# Patient Record
Sex: Male | Born: 1995 | Race: White | Hispanic: No | Marital: Married | Smoking: Never smoker
Health system: Southern US, Community
[De-identification: ages and names within clinical notes are randomized; demographics above are authoritative.]

## PROBLEM LIST (undated history)

## (undated) DIAGNOSIS — K219 Gastro-esophageal reflux disease without esophagitis: Secondary | ICD-10-CM

## (undated) DIAGNOSIS — F909 Attention-deficit hyperactivity disorder, unspecified type: Secondary | ICD-10-CM

## (undated) DIAGNOSIS — D690 Allergic purpura: Secondary | ICD-10-CM

## (undated) DIAGNOSIS — L709 Acne, unspecified: Secondary | ICD-10-CM

## (undated) HISTORY — DX: Acne, unspecified: L70.9

## (undated) HISTORY — DX: Gastro-esophageal reflux disease without esophagitis: K21.9

## (undated) HISTORY — PX: KNEE SURGERY: SHX244

---
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
## 2014-11-29 ENCOUNTER — Encounter (HOSPITAL_COMMUNITY): Payer: Self-pay

## 2014-11-29 ENCOUNTER — Emergency Department (HOSPITAL_COMMUNITY): Payer: BLUE CROSS/BLUE SHIELD

## 2014-11-29 ENCOUNTER — Emergency Department (HOSPITAL_COMMUNITY)
Admission: EM | Admit: 2014-11-29 | Discharge: 2014-11-29 | Disposition: A | Discharge status: Home / Self Care | Payer: BLUE CROSS/BLUE SHIELD | Attending: Emergency Medicine | Admitting: Emergency Medicine

## 2014-11-29 DIAGNOSIS — R110 Nausea: Secondary | ICD-10-CM | POA: Diagnosis not present

## 2014-11-29 DIAGNOSIS — R1030 Lower abdominal pain, unspecified: Secondary | ICD-10-CM | POA: Diagnosis present

## 2014-11-29 DIAGNOSIS — R1084 Generalized abdominal pain: Secondary | ICD-10-CM

## 2014-11-29 DIAGNOSIS — Z79899 Other long term (current) drug therapy: Secondary | ICD-10-CM | POA: Insufficient documentation

## 2014-11-29 DIAGNOSIS — Z872 Personal history of diseases of the skin and subcutaneous tissue: Secondary | ICD-10-CM | POA: Diagnosis not present

## 2014-11-29 DIAGNOSIS — M545 Low back pain: Secondary | ICD-10-CM | POA: Diagnosis not present

## 2014-11-29 DIAGNOSIS — Z8719 Personal history of other diseases of the digestive system: Secondary | ICD-10-CM | POA: Insufficient documentation

## 2014-11-29 DIAGNOSIS — D72829 Elevated white blood cell count, unspecified: Secondary | ICD-10-CM

## 2014-11-29 HISTORY — DX: Allergic purpura: D69.0

## 2014-11-29 LAB — COMPREHENSIVE METABOLIC PANEL
ALBUMIN: 4.8 g/dL (ref 3.5–5.0)
ALT: 16 U/L — AB (ref 17–63)
AST: 18 U/L (ref 15–41)
Alkaline Phosphatase: 100 U/L (ref 38–126)
Anion gap: 8 (ref 5–15)
BILIRUBIN TOTAL: 1.3 mg/dL — AB (ref 0.3–1.2)
BUN: 10 mg/dL (ref 6–20)
CHLORIDE: 98 mmol/L — AB (ref 101–111)
CO2: 30 mmol/L (ref 22–32)
Calcium: 9.4 mg/dL (ref 8.9–10.3)
Creatinine, Ser: 0.98 mg/dL (ref 0.61–1.24)
GFR calc Af Amer: >60 mL/min (ref >60)
GFR calc non Af Amer: >60 mL/min (ref >60)
GLUCOSE: 102 mg/dL — AB (ref 65–99)
POTASSIUM: 4 mmol/L (ref 3.5–5.1)
Sodium: 136 mmol/L (ref 135–145)
Total Protein: 8.5 g/dL — ABNORMAL HIGH (ref 6.5–8.1)

## 2014-11-29 LAB — CBC WITH DIFFERENTIAL/PLATELET
BASOS ABS: 0 10*3/uL (ref 0.0–0.1)
BASOS PCT: 0 %
Eosinophils Absolute: 0.1 10*3/uL (ref 0.0–0.7)
Eosinophils Relative: 0 %
HEMATOCRIT: 50.6 % (ref 39.0–52.0)
Hemoglobin: 18.1 g/dL — ABNORMAL HIGH (ref 13.0–17.0)
Lymphocytes Relative: 6 %
Lymphs Abs: 1.4 10*3/uL (ref 0.7–4.0)
MCH: 32.8 pg (ref 26.0–34.0)
MCHC: 35.8 g/dL (ref 30.0–36.0)
MCV: 91.7 fL (ref 78.0–100.0)
Monocytes Absolute: 1.8 10*3/uL — ABNORMAL HIGH (ref 0.1–1.0)
Monocytes Relative: 8 %
NEUTROS ABS: 19.9 10*3/uL — AB (ref 1.7–7.7)
Neutrophils Relative %: 86 %
Platelets: 216 10*3/uL (ref 150–400)
RBC: 5.52 MIL/uL (ref 4.22–5.81)
RDW: 12.2 % (ref 11.5–15.5)
WBC: 23.2 10*3/uL — AB (ref 4.0–10.5)

## 2014-11-29 LAB — URINALYSIS, ROUTINE W REFLEX MICROSCOPIC
Bilirubin Urine: NEGATIVE
Glucose, UA: NEGATIVE mg/dL
Hgb urine dipstick: NEGATIVE
Ketones, ur: 15 mg/dL — AB
Leukocytes, UA: NEGATIVE
NITRITE: NEGATIVE
Protein, ur: NEGATIVE mg/dL
SPECIFIC GRAVITY, URINE: 1.025 (ref 1.005–1.030)
Urobilinogen, UA: 1 mg/dL (ref 0.0–1.0)
pH: 7 (ref 5.0–8.0)

## 2014-11-29 LAB — LIPASE, BLOOD: Lipase: 28 U/L (ref 22–51)

## 2014-11-29 MED ORDER — SODIUM CHLORIDE 0.9 % IV BOLUS (SEPSIS)
1000.0000 mL | Freq: Once | INTRAVENOUS | Status: AC
  Administered 2014-11-29: 1000 mL via INTRAVENOUS

## 2014-11-29 MED ORDER — IOHEXOL 300 MG/ML  SOLN
100.0000 mL | Freq: Once | INTRAMUSCULAR | Status: AC | PRN
  Administered 2014-11-29: 100 mL via INTRAVENOUS

## 2014-11-29 MED ORDER — DICYCLOMINE HCL 20 MG PO TABS: 20.0000 mg | ORAL_TABLET | Freq: Two times a day (BID) | ORAL | Status: DC

## 2014-11-29 MED ORDER — DIPHENHYDRAMINE HCL 50 MG/ML IJ SOLN
25.0000 mg | Freq: Once | INTRAMUSCULAR | Status: AC
  Administered 2014-11-29: 25 mg via INTRAVENOUS
  Filled 2014-11-29: qty 1

## 2014-11-29 MED ORDER — HYDROCODONE-ACETAMINOPHEN 5-325 MG PO TABS: 1.0000 | ORAL_TABLET | Freq: Four times a day (QID) | ORAL | Status: DC | PRN

## 2014-11-29 MED ORDER — NAPROXEN 500 MG PO TABS: 500.0000 mg | ORAL_TABLET | Freq: Two times a day (BID) | ORAL | Status: DC

## 2014-11-29 MED ORDER — ONDANSETRON HCL 4 MG/2ML IJ SOLN
4.0000 mg | Freq: Once | INTRAMUSCULAR | Status: AC
  Administered 2014-11-29: 4 mg via INTRAVENOUS
  Filled 2014-11-29: qty 2

## 2014-11-29 MED ORDER — HYDROMORPHONE HCL 1 MG/ML IJ SOLN
0.5000 mg | Freq: Once | INTRAMUSCULAR | Status: AC
  Administered 2014-11-29: 0.5 mg via INTRAVENOUS
  Filled 2014-11-29: qty 1

## 2014-11-29 MED ORDER — ONDANSETRON HCL 4 MG PO TABS: 4.0000 mg | ORAL_TABLET | Freq: Three times a day (TID) | ORAL | Status: DC | PRN

## 2014-11-29 MED ORDER — DICYCLOMINE HCL 10 MG PO CAPS
10.0000 mg | ORAL_CAPSULE | Freq: Once | ORAL | Status: AC
  Administered 2014-11-29: 10 mg via ORAL
  Filled 2014-11-29 (×2): qty 1

## 2014-11-29 MED ORDER — PROCHLORPERAZINE EDISYLATE 5 MG/ML IJ SOLN
5.0000 mg | Freq: Once | INTRAMUSCULAR | Status: AC
  Administered 2014-11-29: 5 mg via INTRAVENOUS
  Filled 2014-11-29: qty 2

## 2014-11-29 MED ORDER — FENTANYL CITRATE (PF) 100 MCG/2ML IJ SOLN
50.0000 ug | Freq: Once | INTRAMUSCULAR | Status: AC
  Administered 2014-11-29: 50 ug via INTRAVENOUS
  Filled 2014-11-29: qty 2

## 2014-11-29 MED ORDER — IOHEXOL 300 MG/ML  SOLN
50.0000 mL | Freq: Once | INTRAMUSCULAR | Status: DC | PRN
  Administered 2014-11-29: 50 mL via ORAL
  Filled 2014-11-29: qty 50

## 2014-11-29 MED ORDER — KETOROLAC TROMETHAMINE 30 MG/ML IJ SOLN
30.0000 mg | Freq: Once | INTRAMUSCULAR | Status: AC
  Administered 2014-11-29: 30 mg via INTRAVENOUS
  Filled 2014-11-29: qty 1

## 2014-11-29 NOTE — ED Notes (Signed)
Pt c/o lower abdominal pain, low back pain, and nausea x 2 days.  Pain score 9/10.  Pt report taking ibuprofen last night.  Denies diarrhea and GU complaints.

## 2014-11-29 NOTE — ED Provider Notes (Addendum)
CSN: [id number]     Arrival date & time 11/29/14  0254 History   First MD Initiated Contact with Patient 11/29/14 [phone]     Chief Complaint  Patient presents with  . Abdominal Pain  . Back Pain  . Nausea     (Consider location/radiation/quality/duration/timing/severity/associated sxs/prior Treatment) The history is provided by the patient and a parent. No language interpreter was used.     Gregory Conway Is a 19 year old male with a past medical history of Henoch-Schnlein purpura presents emergency Department with a chief complaint of abdominal pain. Patient states it began 2 days ago. He describes aching pain in his suprapubic region. He has associated back pain, pain in the bilateral lower quadrants, which she feels is worse on the right. He has had very little appetite, severe nausea without vomiting or diarrhea. He denies fevers at home. He has associated myalgias.   Past Medical History  Diagnosis Date  . Acne   . GERD (gastroesophageal reflux disease)   . HSP (Henoch Schonlein purpura) (HCC)    History reviewed. No pertinent past surgical history. History reviewed. No pertinent family history. Social History  Substance Use Topics  . Smoking status: Never Smoker   . Smokeless tobacco: None  . Alcohol Use: Yes     Comment: rarely    Review of Systems  Ten systems reviewed and are negative for acute change, except as noted in the HPI.    Allergies  Review of patient's allergies indicates no known allergies.  Home Medications   Prior to Admission medications   Medication Sig Start Date End Date Taking? Authorizing Provider  glucosamine-chondroitin 500-400 MG tablet Take 1 tablet by mouth daily.   Yes Historical Provider, MD  ibuprofen (ADVIL,MOTRIN) 200 MG tablet Take 200 mg by mouth every 6 (six) hours as needed for fever, headache, mild pain, moderate pain or cramping.   Yes Historical Provider, MD   BP 141/78 mmHg  Pulse 66  Temp(Src) 98.1 F (36.7 C) (Oral)   Resp 16  SpO2 99% Physical Exam  Constitutional: He appears well-developed and well-nourished. No distress.  HENT:  Head: Normocephalic and atraumatic.  Eyes: Conjunctivae are normal. No scleral icterus.  Neck: Normal range of motion. Neck supple.  Cardiovascular: Normal rate, regular rhythm and normal heart sounds.   Pulmonary/Chest: Effort normal and breath sounds normal. No respiratory distress.  Abdominal: Soft. There is tenderness (suprapubic region, no cva tenderness).  Musculoskeletal: He exhibits no edema.  Neurological: He is alert.  Skin: Skin is warm and dry. He is not diaphoretic.  Psychiatric: His behavior is normal.  Nursing note and vitals reviewed.   ED Course  Procedures (including critical care time) Labs Review Labs Reviewed  CBC WITH DIFFERENTIAL/PLATELET - Abnormal; Notable for the following:    WBC 23.2 (*)    Hemoglobin 18.1 (*)    Neutro Abs 19.9 (*)    Monocytes Absolute 1.8 (*)    All other components within normal limits  URINALYSIS, ROUTINE W REFLEX MICROSCOPIC (NOT AT [facility]) - Abnormal; Notable for the following:    APPearance CLOUDY (*)    Ketones, ur 15 (*)    All other components within normal limits  COMPREHENSIVE METABOLIC PANEL  LIPASE, BLOOD    Imaging Review No results found. I have personally reviewed and evaluated these images and lab results as part of my medical decision-making.   EKG Interpretation None      MDM   Final diagnoses:  Generalized abdominal pain  Nausea  Leukocytosis  Patient with abdominal pain. Concern for possible appendicitis, gastroenteritis, uti Labs and imaging pending  Afebrile and hds   11:08 AM BP 141/78 mmHg  Pulse 66  Temp(Src) 98.1 F (36.7 C) (Oral)  Resp 16  SpO2 99% Patient with nausea, pain has returned.  Wbc count 23.2  2:45 PM Patient CT shows small bowel enteritis. With his elevated white count, concern for possible Chron's.  Patient seen in shared visit with attending  physician. Dr. Criss Alvine has spoken with the relatives, who is concerned as well about her elevated white count, but does not see any acute abnormality or offer further workup suggested concerning the CT scan. Currently treating pain, nausea.  Po challenge.  Strong warning precautions given secondary to his high white count.  Patient is nontoxic, nonseptic appearing, in no apparent distress.  Patient's pain and other symptoms adequately managed in emergency department.  Fluid bolus given.  Labs, imaging and vitals reviewed.  Patient does not meet the SIRS or Sepsis criteria.  On repeat exam patient does not have a surgical abdomin and there are no peritoneal signs.  No indication of appendicitis, bowel obstruction, bowel perforation, cholecystitis, diverticulitis, .  Patient discharged home with symptomatic treatment and given strict instructions for follow-up with their primary care physician.  I have also discussed reasons to return immediately to the ER.  Patient expresses understanding and agrees with plan.     Arthor Captain, PA-C 11/29/14 1551  Pricilla Loveless, MD 11/30/14 4540  Arthor Captain, PA-C 01/07/15 1708  Pricilla Loveless, MD 01/09/15 [phone]

## 2014-11-29 NOTE — ED Notes (Signed)
Patient transported to CT 

## 2014-11-29 NOTE — Discharge Instructions (Signed)
Abdominal (belly) pain can be caused by many things. Your caregiver performed an examination and possibly ordered blood/urine tests and imaging (CT scan, x-rays, ultrasound). Many cases can be observed and treated at home after initial evaluation in the emergency department. Even though you are being discharged home, abdominal pain can be unpredictable. Therefore, you need a repeated exam if your pain does not resolve, returns, or worsens. Most patients with abdominal pain don't have to be admitted to the hospital or have surgery, but serious problems like appendicitis and gallbladder attacks can start out as nonspecific pain. Many abdominal conditions cannot be diagnosed in one visit, so follow-up evaluations are very important. SEEK IMMEDIATE MEDICAL ATTENTION IF: The pain does not go away or becomes severe.  A temperature above 101 develops.  Repeated vomiting occurs (multiple episodes).  The pain becomes localized to portions of the abdomen. The right side could possibly be appendicitis. In an adult, the left lower portion of the abdomen could be colitis or diverticulitis.  Blood is being passed in stools or vomit (bright red or black tarry stools).  Return also if you develop chest pain, difficulty breathing, dizziness or fainting, or become confused, poorly responsive, or inconsolable (young children). Viral Gastroenteritis Viral gastroenteritis is also known as stomach flu. This condition affects the stomach and intestinal tract. It can cause sudden diarrhea and vomiting. The illness typically lasts 3 to 8 days. Most people develop an immune response that eventually gets rid of the virus. While this natural response develops, the virus can make you quite ill. CAUSES  Many different viruses can cause gastroenteritis, such as rotavirus or noroviruses. You can catch one of these viruses by consuming contaminated food or water. You may also catch a virus by sharing utensils or other personal items with  an infected person or by touching a contaminated surface. SYMPTOMS  The most common symptoms are diarrhea and vomiting. These problems can cause a severe loss of body fluids (dehydration) and a body salt (electrolyte) imbalance. Other symptoms may include:  Fever.  Headache.  Fatigue.  Abdominal pain. DIAGNOSIS  Your caregiver can usually diagnose viral gastroenteritis based on your symptoms and a physical exam. A stool sample may also be taken to test for the presence of viruses or other infections. TREATMENT  This illness typically goes away on its own. Treatments are aimed at rehydration. The most serious cases of viral gastroenteritis involve vomiting so severely that you are not able to keep fluids down. In these cases, fluids must be given through an intravenous line (IV). HOME CARE INSTRUCTIONS   Drink enough fluids to keep your urine clear or pale yellow. Drink small amounts of fluids frequently and increase the amounts as tolerated.  Ask your caregiver for specific rehydration instructions.  Avoid:  Foods high in sugar.  Alcohol.  Carbonated drinks.  Tobacco.  Juice.  Caffeine drinks.  Extremely hot or cold fluids.  Fatty, greasy foods.  Too much intake of anything at one time.  Dairy products until 24 to 48 hours after diarrhea stops.  You may consume probiotics. Probiotics are active cultures of beneficial bacteria. They may lessen the amount and number of diarrheal stools in adults. Probiotics can be found in yogurt with active cultures and in supplements.  Wash your hands well to avoid spreading the virus.  Only take over-the-counter or prescription medicines for pain, discomfort, or fever as directed by your caregiver. Do not give aspirin to children. Antidiarrheal medicines are not recommended.  Ask your caregiver  if you should continue to take your regular prescribed and over-the-counter medicines.  Keep all follow-up appointments as directed by your  caregiver. SEEK IMMEDIATE MEDICAL CARE IF:   You are unable to keep fluids down.  You do not urinate at least once every 6 to 8 hours.  You develop shortness of breath.  You notice blood in your stool or vomit. This may look like coffee grounds.  You have abdominal pain that increases or is concentrated in one small area (localized).  You have persistent vomiting or diarrhea.  You have a fever.  The patient is a child younger than 3 months, and he or she has a fever.  The patient is a child older than 3 months, and he or she has a fever and persistent symptoms.  The patient is a child older than 3 months, and he or she has a fever and symptoms suddenly get worse.  The patient is a baby, and he or she has no tears when crying. MAKE SURE YOU:   Understand these instructions.  Will watch your condition.  Will get help right away if you are not doing well or get worse.   This information is not intended to replace advice given to you by your health care provider. Make sure you discuss any questions you have with your health care provider.   Document Released: 02/10/2005 Document Revised: 05/05/2011 Document Reviewed: 11/27/2010 Elsevier Interactive Patient Education [employer]

## 2014-12-03 ENCOUNTER — Emergency Department (HOSPITAL_COMMUNITY)
Admission: EM | Admit: 2014-12-03 | Discharge: 2014-12-03 | Disposition: A | Discharge status: Home / Self Care | Payer: BLUE CROSS/BLUE SHIELD | Attending: Emergency Medicine | Admitting: Emergency Medicine

## 2014-12-03 ENCOUNTER — Encounter (HOSPITAL_COMMUNITY): Payer: Self-pay | Admitting: Emergency Medicine

## 2014-12-03 DIAGNOSIS — Z79899 Other long term (current) drug therapy: Secondary | ICD-10-CM | POA: Insufficient documentation

## 2014-12-03 DIAGNOSIS — R110 Nausea: Secondary | ICD-10-CM | POA: Diagnosis not present

## 2014-12-03 DIAGNOSIS — Z8719 Personal history of other diseases of the digestive system: Secondary | ICD-10-CM | POA: Diagnosis not present

## 2014-12-03 DIAGNOSIS — R195 Other fecal abnormalities: Secondary | ICD-10-CM | POA: Diagnosis not present

## 2014-12-03 DIAGNOSIS — Z872 Personal history of diseases of the skin and subcutaneous tissue: Secondary | ICD-10-CM | POA: Insufficient documentation

## 2014-12-03 DIAGNOSIS — R1084 Generalized abdominal pain: Secondary | ICD-10-CM

## 2014-12-03 DIAGNOSIS — Z862 Personal history of diseases of the blood and blood-forming organs and certain disorders involving the immune mechanism: Secondary | ICD-10-CM | POA: Insufficient documentation

## 2014-12-03 LAB — COMPREHENSIVE METABOLIC PANEL
ALT: 15 U/L — ABNORMAL LOW (ref 17–63)
ANION GAP: 5 (ref 5–15)
AST: 16 U/L (ref 15–41)
Albumin: 4.1 g/dL (ref 3.5–5.0)
Alkaline Phosphatase: 80 U/L (ref 38–126)
BUN: 17 mg/dL (ref 6–20)
CHLORIDE: 104 mmol/L (ref 101–111)
CO2: 30 mmol/L (ref 22–32)
Calcium: 8.9 mg/dL (ref 8.9–10.3)
Creatinine, Ser: 1.17 mg/dL (ref 0.61–1.24)
GFR calc non Af Amer: >60 mL/min (ref >60)
Glucose, Bld: 98 mg/dL (ref 65–99)
POTASSIUM: 3.8 mmol/L (ref 3.5–5.1)
SODIUM: 139 mmol/L (ref 135–145)
Total Bilirubin: 0.9 mg/dL (ref 0.3–1.2)
Total Protein: 7.3 g/dL (ref 6.5–8.1)

## 2014-12-03 LAB — DIFFERENTIAL
BASOS ABS: 0 10*3/uL (ref 0.0–0.1)
Basophils Relative: 0 %
EOS ABS: 0.2 10*3/uL (ref 0.0–0.7)
Eosinophils Relative: 1 %
LYMPHS ABS: 1 10*3/uL (ref 0.7–4.0)
LYMPHS PCT: 7 %
Monocytes Absolute: 1.3 10*3/uL — ABNORMAL HIGH (ref 0.1–1.0)
Monocytes Relative: 9 %
NEUTROS PCT: 83 %
Neutro Abs: 11.9 10*3/uL — ABNORMAL HIGH (ref 1.7–7.7)

## 2014-12-03 LAB — URINALYSIS, ROUTINE W REFLEX MICROSCOPIC
Bilirubin Urine: NEGATIVE
GLUCOSE, UA: NEGATIVE mg/dL
Hgb urine dipstick: NEGATIVE
Ketones, ur: 15 mg/dL — AB
LEUKOCYTES UA: NEGATIVE
Nitrite: NEGATIVE
PH: 6.5 (ref 5.0–8.0)
PROTEIN: NEGATIVE mg/dL
Specific Gravity, Urine: 1.019 (ref 1.005–1.030)
Urobilinogen, UA: 1 mg/dL (ref 0.0–1.0)

## 2014-12-03 LAB — CBC
HCT: 45.2 % (ref 39.0–52.0)
HEMOGLOBIN: 16.4 g/dL (ref 13.0–17.0)
MCH: 33 pg (ref 26.0–34.0)
MCHC: 36.3 g/dL — ABNORMAL HIGH (ref 30.0–36.0)
MCV: 90.9 fL (ref 78.0–100.0)
Platelets: 207 10*3/uL (ref 150–400)
RBC: 4.97 MIL/uL (ref 4.22–5.81)
RDW: 12 % (ref 11.5–15.5)
WBC: 14.4 10*3/uL — ABNORMAL HIGH (ref 4.0–10.5)

## 2014-12-03 LAB — I-STAT CG4 LACTIC ACID, ED: LACTIC ACID, VENOUS: 0.5 mmol/L (ref 0.5–2.0)

## 2014-12-03 LAB — LIPASE, BLOOD: LIPASE: 35 U/L (ref 22–51)

## 2014-12-03 LAB — POC OCCULT BLOOD, ED: Fecal Occult Bld: NEGATIVE

## 2014-12-03 MED ORDER — ONDANSETRON HCL 4 MG/2ML IJ SOLN
4.0000 mg | Freq: Once | INTRAMUSCULAR | Status: AC
  Administered 2014-12-03: 4 mg via INTRAVENOUS
  Filled 2014-12-03: qty 2

## 2014-12-03 MED ORDER — SODIUM CHLORIDE 0.9 % IV BOLUS (SEPSIS)
1000.0000 mL | Freq: Once | INTRAVENOUS | Status: AC
  Administered 2014-12-03: 1000 mL via INTRAVENOUS

## 2014-12-03 MED ORDER — ONDANSETRON 4 MG PO TBDP: 4.0000 mg | ORAL_TABLET | Freq: Three times a day (TID) | ORAL | Status: DC | PRN

## 2014-12-03 MED ORDER — OXYCODONE-ACETAMINOPHEN 5-325 MG PO TABS
2.0000 | ORAL_TABLET | Freq: Once | ORAL | Status: AC
  Administered 2014-12-03: 2 via ORAL
  Filled 2014-12-03: qty 2

## 2014-12-03 MED ORDER — OXYCODONE-ACETAMINOPHEN 5-325 MG PO TABS: 1.0000 | ORAL_TABLET | Freq: Four times a day (QID) | ORAL | Status: DC | PRN

## 2014-12-03 MED ORDER — HYDROMORPHONE HCL 1 MG/ML IJ SOLN
1.0000 mg | Freq: Once | INTRAMUSCULAR | Status: AC
  Administered 2014-12-03: 1 mg via INTRAVENOUS
  Filled 2014-12-03: qty 1

## 2014-12-03 MED ORDER — HYDROMORPHONE HCL 2 MG PO TABS: 2.0000 mg | ORAL_TABLET | ORAL | Status: DC | PRN

## 2014-12-03 NOTE — ED Notes (Signed)
MD at bedside. 

## 2014-12-03 NOTE — ED Notes (Signed)
Patient states that he is dizzy, room is spinning, and in pain. RN aware.

## 2014-12-03 NOTE — ED Provider Notes (Addendum)
Medical screening examination/treatment/procedure(s) were conducted as a shared visit with non-physician practitioner(s) and myself.  I personally evaluated the patient during the encounter.   EKG Interpretation None      Results for orders placed or performed during the hospital encounter of 12/03/14  Lipase, blood  Result Value Ref Range   Lipase 35 22 - 51 U/L  Comprehensive metabolic panel  Result Value Ref Range   Sodium 139 135 - 145 mmol/L   Potassium 3.8 3.5 - 5.1 mmol/L   Chloride 104 101 - 111 mmol/L   CO2 30 22 - 32 mmol/L   Glucose, Bld 98 65 - 99 mg/dL   BUN 17 6 - 20 mg/dL   Creatinine, Ser 1.47 0.61 - 1.24 mg/dL   Calcium 8.9 8.9 - 82.9 mg/dL   Total Protein 7.3 6.5 - 8.1 g/dL   Albumin 4.1 3.5 - 5.0 g/dL   AST 16 15 - 41 U/L   ALT 15 (L) 17 - 63 U/L   Alkaline Phosphatase 80 38 - 126 U/L   Total Bilirubin 0.9 0.3 - 1.2 mg/dL   GFR calc non Af Amer >60 >60 mL/min   GFR calc Af Amer >60 >60 mL/min   Anion gap 5 5 - 15  CBC  Result Value Ref Range   WBC 14.4 (H) 4.0 - 10.5 K/uL   RBC 4.97 4.22 - 5.81 MIL/uL   Hemoglobin 16.4 13.0 - 17.0 g/dL   HCT 56.2 13.0 - 86.5 %   MCV 90.9 78.0 - 100.0 fL   MCH 33.0 26.0 - 34.0 pg   MCHC 36.3 (H) 30.0 - 36.0 g/dL   RDW 78.4 69.6 - 29.5 %   Platelets 207 150 - 400 K/uL  Urinalysis, Routine w reflex microscopic (not at [facility])  Result Value Ref Range   Color, Urine YELLOW YELLOW   APPearance CLOUDY (A) CLEAR   Specific Gravity, Urine 1.019 1.005 - 1.030   pH 6.5 5.0 - 8.0   Glucose, UA NEGATIVE NEGATIVE mg/dL   Hgb urine dipstick NEGATIVE NEGATIVE   Bilirubin Urine NEGATIVE NEGATIVE   Ketones, ur 15 (A) NEGATIVE mg/dL   Protein, ur NEGATIVE NEGATIVE mg/dL   Urobilinogen, UA 1.0 0.0 - 1.0 mg/dL   Nitrite NEGATIVE NEGATIVE   Leukocytes, UA NEGATIVE NEGATIVE  Differential  Result Value Ref Range   Neutrophils Relative % 83 %   Neutro Abs 11.9 (H) 1.7 - 7.7 K/uL   Lymphocytes Relative 7 %   Lymphs Abs 1.0 0.7 - 4.0  K/uL   Monocytes Relative 9 %   Monocytes Absolute 1.3 (H) 0.1 - 1.0 K/uL   Eosinophils Relative 1 %   Eosinophils Absolute 0.2 0.0 - 0.7 K/uL   Basophils Relative 0 %   Basophils Absolute 0.0 0.0 - 0.1 K/uL  POC occult blood, ED Provider will collect  Result Value Ref Range   Fecal Occult Bld NEGATIVE NEGATIVE  I-Stat CG4 Lactic Acid, ED  Result Value Ref Range   Lactic Acid, Venous 0.50 0.5 - 2.0 mmol/L   Ct Abdomen Pelvis W Contrast  11/29/2014   CLINICAL DATA:  Acute lower abdominal pain.  EXAM: CT ABDOMEN AND PELVIS WITH CONTRAST  TECHNIQUE: Multidetector CT imaging of the abdomen and pelvis was performed using the standard protocol following bolus administration of intravenous contrast.  CONTRAST:  OMNIPAQUE IOHEXOL 300 MG/ML  SOLN  COMPARISON:  None.  FINDINGS: Visualized lung bases appear normal. No significant osseous abnormality is noted.  No gallstones are noted.  The liver, spleen and pancreas appear normal. Adrenal glands and kidneys appear normal. No hydronephrosis or renal obstruction is noted. The appendix appears normal. There is no evidence of bowel obstruction. There is noted fold thickening involving the distal duodenum and proximal jejunum suggesting focal enteritis. No abnormal fluid collection is noted. Urinary bladder appears normal. No significant adenopathy is noted.  IMPRESSION: The appendix appears normal. There is noted fold thickening involving the distal duodenum and proximal jejunum suggesting focal enteritis.   Electronically Signed   By: Lupita Raider, M.D.   On: 11/29/2014 12:09    Patient returns with persistent symptoms similar to what he was seen on October 5. Today's labs without significant abnormalities compared to that. CT on the fifth raise concerns for focal enteritis in the distal duodenum and proximal jejunum. This could be consistent with inflammatory bowel disease. Patient here without any significant vital signs abnormalities no hypotension no  tachycardia area patient does have difficulty with pain control. Will work with additional pain medicines for him at home. Will give referral to [facility]. Patient will go home with mother and the [place] area. He does have a local doctor there that they can use as a primary care doctor that followed him by prior to going to college. Patient will return for any new or worse symptoms.  Patient's abdomen is flat soft without any significant guarding or tenderness.  Vanetta Mulders, MD 12/03/14 2034  Vanetta Mulders, MD 12/03/14 2042

## 2014-12-03 NOTE — ED Provider Notes (Signed)
CSN: [id number]     Arrival date & time 12/03/14  1421 History   First MD Initiated Contact with Patient 12/03/14 1522     Chief Complaint  Patient presents with  . Abdominal Pain  . nausea      (Consider location/radiation/quality/duration/timing/severity/associated sxs/prior Treatment) HPI Comments: Patient presents with complaint of continued abdominal pain, nausea. Patient was seen in the emergency department on 11/29/14 with generalized abdominal pain. He had elevated white blood cell count and CT scan showing enteritis. Patient was discharged home with Zofran, Bentyl, and naproxen. Patient has been taking these medications. He has continued to have generalized abdominal pain, described as sharp, nonradiating. Patient has not had symptoms like this in the past. Patient takes ibuprofen 800 mg approximately 3 times a week. Patient drinks occasionally but denies any heavy drinking prior to symptoms starting. Patient states that he is noted two instances of right red blood in his stool over the past several days. Last bowel movement was 2 days ago. No diarrhea or constipation. No urinary symptoms. He has been eating and drinking without difficulty.   Patient is a 19 y.o. male presenting with abdominal pain. The history is provided by the patient, medical records and a parent.  Abdominal Pain Associated symptoms: nausea   Associated symptoms: no chest pain, no cough, no diarrhea, no dysuria, no fever, no hematuria, no sore throat and no vomiting     Past Medical History  Diagnosis Date  . Acne   . GERD (gastroesophageal reflux disease)   . HSP (Henoch Schonlein purpura) (HCC)    History reviewed. No pertinent past surgical history. No family history on file. Social History  Substance Use Topics  . Smoking status: Never Smoker   . Smokeless tobacco: None  . Alcohol Use: Yes     Comment: rarely    Review of Systems  Constitutional: Negative for fever.  HENT: Negative for rhinorrhea  and sore throat.   Eyes: Negative for redness.  Respiratory: Negative for cough.   Cardiovascular: Negative for chest pain.  Gastrointestinal: Positive for nausea, abdominal pain and blood in stool. Negative for vomiting and diarrhea.  Genitourinary: Negative for dysuria, frequency and hematuria.  Musculoskeletal: Negative for myalgias.  Skin: Negative for rash.  Neurological: Negative for headaches.    Allergies  Review of patient's allergies indicates no known allergies.  Home Medications   Prior to Admission medications   Medication Sig Start Date End Date Taking? Authorizing Provider  dicyclomine (BENTYL) 20 MG tablet Take 1 tablet (20 mg total) by mouth 2 (two) times daily. 11/29/14  Yes Arthor Captain, PA-C  glucosamine-chondroitin 500-400 MG tablet Take 1 tablet by mouth daily.   Yes Historical Provider, MD  HYDROcodone-acetaminophen (NORCO) 5-325 MG tablet Take 1 tablet by mouth every 6 (six) hours as needed for severe pain. 11/29/14  Yes Arthor Captain, PA-C  ibuprofen (ADVIL,MOTRIN) 200 MG tablet Take 200 mg by mouth every 6 (six) hours as needed for fever, headache, mild pain, moderate pain or cramping.   Yes Historical Provider, MD  naproxen (NAPROSYN) 500 MG tablet Take 1 tablet (500 mg total) by mouth 2 (two) times daily with a meal. 11/29/14  Yes Abigail Harris, PA-C  ondansetron (ZOFRAN) 4 MG tablet Take 1 tablet (4 mg total) by mouth every 8 (eight) hours as needed for nausea or vomiting. 11/29/14  Yes Abigail Harris, PA-C   BP 151/94 mmHg  Pulse 61  Temp(Src) 98.1 F (36.7 C) (Oral)  Resp 18  SpO2 100%  Physical Exam  Constitutional: He appears well-developed and well-nourished.  HENT:  Head: Normocephalic and atraumatic.  Mouth/Throat: Oropharynx is clear and moist.  Eyes: Conjunctivae are normal. Right eye exhibits no discharge. Left eye exhibits no discharge.  Neck: Normal range of motion. Neck supple.  Cardiovascular: Normal rate, regular rhythm and normal  heart sounds.   No murmur heard. Pulmonary/Chest: Effort normal and breath sounds normal. No respiratory distress. He has no wheezes. He has no rales.  Abdominal: Soft. Bowel sounds are normal. He exhibits no distension. There is tenderness (Moderate, generalized). There is no rebound and no guarding.  Neurological: He is alert.  Skin: Skin is warm and dry.  Psychiatric: He has a normal mood and affect.  Nursing note and vitals reviewed.   ED Course  Procedures (including critical care time) Labs Review Labs Reviewed  COMPREHENSIVE METABOLIC PANEL - Abnormal; Notable for the following:    ALT 15 (*)    All other components within normal limits  CBC - Abnormal; Notable for the following:    WBC 14.4 (*)    MCHC 36.3 (*)    All other components within normal limits  URINALYSIS, ROUTINE W REFLEX MICROSCOPIC (NOT AT [facility]) - Abnormal; Notable for the following:    APPearance CLOUDY (*)    Ketones, ur 15 (*)    All other components within normal limits  DIFFERENTIAL - Abnormal; Notable for the following:    Neutro Abs 11.9 (*)    Monocytes Absolute 1.3 (*)    All other components within normal limits  LIPASE, BLOOD  POC OCCULT BLOOD, ED  I-STAT CG4 LACTIC ACID, ED    Imaging Review No results found. I have personally reviewed and evaluated these images and lab results as part of my medical decision-making.   EKG Interpretation None       Patient seen and examined. Work-up initiated. Medications ordered.   Vital signs reviewed and are as follows: BP 151/94 mmHg  Pulse 61  Temp(Src) 98.1 F (36.7 C) (Oral)  Resp 18  SpO2 100%  7:09 PM Patient and mother updated on results. Patient has been drinking soda in the room. Exam unchanged.   Plan: d/c to home with pain medication, colace, GI f/u.   The patient was urged to return to the Emergency Department immediately with worsening of current symptoms, worsening abdominal pain, persistent vomiting, blood noted in stools,  fever, or any other concerns. The patient verbalized understanding.   8:03 PM Patient went to the bathroom and had 'dry heaves'. IV zofran given. I have tried to encourage him to take oral percocet. Will reassess.   9:34 PM Patient discussed with and seen by Dr. Deretha Emory who has seen.   Patient is going to go home. Percocet and Dilaudid PO given per Dr. Deretha Emory discussion with family. Instructed not to combine these medications with others containing tylenol. Urged not to drink alcohol, drive, or perform any other activities that requires focus while taking these medications. The patient verbalizes understanding and agrees with the plan.  The patient was urged to return to the Emergency Department immediately with worsening of current symptoms, worsening abdominal pain, persistent vomiting, blood noted in stools, fever, or any other concerns. The patient verbalized understanding.   Encouraged them to contact GI per previous referral ([place]).  MDM   Final diagnoses:  Generalized abdominal pain   Patient returns with continued abdominal pain. Labs today show improving white blood cell count. Otherwise workup is unremarkable. Main complaint at this time  is pain control. Symptoms treated while in the emergency department. Patient will need to follow-up with GI to rule out inflammatory bowel disease. He is tolerating orals here. Afebrile. No indications for admission/re-imaging at this time.  Vitals are stable, no fever. No signs of dehydration, tolerating PO's. Lungs are clear. No focal abdominal pain. Supportive therapy indicated with return if symptoms worsen. Patient counseled.     Renne Crigler, PA-C 12/03/14 2141

## 2014-12-03 NOTE — Discharge Instructions (Signed)
Please read and follow all provided instructions.  Your diagnoses today include:  1. Generalized abdominal pain     Tests performed today include:  Blood counts and electrolytes  Blood tests to check liver and kidney function  Blood tests to check pancreas function  Urine test to look for infection  Vital signs. See below for your results today.   Medications prescribed:   Percocet (oxycodone/acetaminophen) - narcotic pain medication  DO NOT drive or perform any activities that require you to be awake and alert because this medicine can make you drowsy. BE VERY CAREFUL not to take multiple medicines containing Tylenol (also called acetaminophen). Doing so can lead to an overdose which can damage your liver and cause liver failure and possibly death.   Dilaudid - narcotic pain medication  DO NOT drive or perform any activities that require you to be awake and alert because this medicine can make you drowsy.   Zofran (ondansetron) - for nausea and vomiting  Take any prescribed medications only as directed.  Home care instructions:   Follow any educational materials contained in this packet.  Follow-up instructions: Please follow-up with your primary care provider and the gastroenterologist referral in the next 3 days for further evaluation of your symptoms.    Return instructions:  SEEK IMMEDIATE MEDICAL ATTENTION IF:  The pain does not go away or becomes severe   A temperature above 101F develops   Repeated vomiting occurs (multiple episodes)   The pain becomes localized to portions of the abdomen. The right side could possibly be appendicitis. In an adult, the left lower portion of the abdomen could be colitis or diverticulitis.   Blood is being passed in stools or vomit (bright red or black tarry stools)   You develop chest pain, difficulty breathing, dizziness or fainting, or become confused, poorly responsive, or inconsolable (young children)  If you have  any other emergent concerns regarding your health  Additional Information: Abdominal (belly) pain can be caused by many things. Your caregiver performed an examination and possibly ordered blood/urine tests and imaging (CT scan, x-rays, ultrasound). Many cases can be observed and treated at home after initial evaluation in the emergency department. Even though you are being discharged home, abdominal pain can be unpredictable. Therefore, you need a repeated exam if your pain does not resolve, returns, or worsens. Most patients with abdominal pain don't have to be admitted to the hospital or have surgery, but serious problems like appendicitis and gallbladder attacks can start out as nonspecific pain. Many abdominal conditions cannot be diagnosed in one visit, so follow-up evaluations are very important.  Your vital signs today were: BP 148/89 mmHg   Pulse 65   Temp(Src) 98.5 F (36.9 C) (Oral)   Resp 16   SpO2 100% If your blood pressure (bp) was elevated above 135/85 this visit, please have this repeated by your doctor within one month. --------------

## 2014-12-03 NOTE — ED Notes (Signed)
Pt c/o abd pain that started last Monday. Pt was seen here on the 5th and sent home with Zofran, Bentyl, Naproxen.  Pt states that pain is still "severe".  Pt denies any n/v/d this am "but prob on the way".  Pt hasnt been eating or drinking per his norm.

## 2014-12-05 ENCOUNTER — Emergency Department (HOSPITAL_COMMUNITY)
Admission: EM | Admit: 2014-12-05 | Discharge: 2014-12-05 | Disposition: A | Discharge status: Home / Self Care | Payer: BLUE CROSS/BLUE SHIELD | Attending: Emergency Medicine | Admitting: Emergency Medicine

## 2014-12-05 ENCOUNTER — Encounter (HOSPITAL_COMMUNITY): Payer: Self-pay | Admitting: *Deleted

## 2014-12-05 DIAGNOSIS — R110 Nausea: Secondary | ICD-10-CM | POA: Insufficient documentation

## 2014-12-05 DIAGNOSIS — R1084 Generalized abdominal pain: Secondary | ICD-10-CM | POA: Diagnosis present

## 2014-12-05 DIAGNOSIS — Z791 Long term (current) use of non-steroidal anti-inflammatories (NSAID): Secondary | ICD-10-CM | POA: Diagnosis not present

## 2014-12-05 DIAGNOSIS — R630 Anorexia: Secondary | ICD-10-CM | POA: Diagnosis not present

## 2014-12-05 DIAGNOSIS — Z872 Personal history of diseases of the skin and subcutaneous tissue: Secondary | ICD-10-CM | POA: Diagnosis not present

## 2014-12-05 DIAGNOSIS — Z8719 Personal history of other diseases of the digestive system: Secondary | ICD-10-CM | POA: Insufficient documentation

## 2014-12-05 DIAGNOSIS — Z862 Personal history of diseases of the blood and blood-forming organs and certain disorders involving the immune mechanism: Secondary | ICD-10-CM | POA: Diagnosis not present

## 2014-12-05 DIAGNOSIS — Z79899 Other long term (current) drug therapy: Secondary | ICD-10-CM | POA: Diagnosis not present

## 2014-12-05 MED ORDER — HYDROMORPHONE HCL 1 MG/ML IJ SOLN
1.0000 mg | Freq: Once | INTRAMUSCULAR | Status: AC
  Administered 2014-12-05: 1 mg via INTRAVENOUS
  Filled 2014-12-05: qty 1

## 2014-12-05 MED ORDER — ONDANSETRON HCL 4 MG/2ML IJ SOLN
4.0000 mg | Freq: Once | INTRAMUSCULAR | Status: AC
  Administered 2014-12-05: 4 mg via INTRAVENOUS
  Filled 2014-12-05: qty 2

## 2014-12-05 MED ORDER — ONDANSETRON 4 MG PO TBDP: 4.0000 mg | ORAL_TABLET | Freq: Three times a day (TID) | ORAL | Status: DC | PRN

## 2014-12-05 MED ORDER — KETOROLAC TROMETHAMINE 30 MG/ML IJ SOLN
30.0000 mg | Freq: Once | INTRAMUSCULAR | Status: AC
  Administered 2014-12-05: 30 mg via INTRAVENOUS
  Filled 2014-12-05: qty 1

## 2014-12-05 NOTE — ED Notes (Signed)
Patient was alert, oriented and stable upon discharge. RN went over AVS and patient had no further questions.  

## 2014-12-05 NOTE — ED Provider Notes (Signed)
CSN: [id number]     Arrival date & time 12/05/14  2003 History   First MD Initiated Contact with Patient 12/05/14 2035     Chief Complaint  Patient presents with  . Abdominal Pain  . Dehydration   HPI  Gregory Conway is a 19 year old male presenting with abdominal pain. Pt states the pain began acutely on 10/3. The pain is sharp and generalized with some increased intensity in his lower quadrants. Pain is associated with nausea. He states that the pain increases with food intake so he has not been eating for the past few days. He was prescribed percocet at previous ED visits which he states makes the pain manageable. The pain is unchanged since it began. He reports that he had some episodes of bloody diarrhea over the weekend but has had normal BMs the past 2 days. He has been seen by this emergency department twice for the same complaint. At the previous ED visit, a CT scan showed enteritis and he was referred to GI for further evaluation. Pt saw a gastroenterologist in [facility] yesterday and is scheduled for EGD and colonoscopy in 3 days. He had a HIDA scan at [facility] earlier today which showed biliary dyskinesia. Pt reports that he was told that he was dehydrated and needed to come to ED for fluids. Denies fevers, chills, chest pain, SOB, cough, vomiting, diarrhea, constipation, dysuria, back pain or headaches.   Past Medical History  Diagnosis Date  . Acne   . GERD (gastroesophageal reflux disease)   . HSP (Henoch Schonlein purpura) (HCC)    History reviewed. No pertinent past surgical history. No family history on file. Social History  Substance Use Topics  . Smoking status: Never Smoker   . Smokeless tobacco: None  . Alcohol Use: Yes     Comment: rarely    Review of Systems  Constitutional: Positive for appetite change. Negative for fever and chills.  Eyes: Negative for visual disturbance.  Respiratory: Negative for cough and shortness of breath.   Cardiovascular: Negative for  chest pain.  Gastrointestinal: Positive for nausea and abdominal pain. Negative for vomiting, diarrhea, constipation and abdominal distention.  Genitourinary: Negative for dysuria, hematuria and flank pain.  Musculoskeletal: Negative for back pain.  Skin: Negative for rash.  Allergic/Immunologic: Negative for immunocompromised state.  Neurological: Negative for dizziness, syncope, light-headedness and headaches.  All other systems reviewed and are negative.     Allergies  Review of patient's allergies indicates no known allergies.  Home Medications   Prior to Admission medications   Medication Sig Start Date End Date Taking? Authorizing Provider  dicyclomine (BENTYL) 10 MG capsule Take 10 mg by mouth 3 (three) times daily before meals.    Yes Historical Provider, MD  dicyclomine (BENTYL) 20 MG tablet Take 1 tablet (20 mg total) by mouth 2 (two) times daily. 11/29/14  Yes Arthor Captain, PA-C  glucosamine-chondroitin 500-400 MG tablet Take 1 tablet by mouth daily.   Yes Historical Provider, MD  HYDROcodone-acetaminophen (NORCO) 5-325 MG tablet Take 1 tablet by mouth every 6 (six) hours as needed for severe pain. 11/29/14  Yes Arthor Captain, PA-C  HYDROmorphone (DILAUDID) 2 MG tablet Take 1 tablet (2 mg total) by mouth every 4 (four) hours as needed for severe pain. 12/03/14  Yes Renne Crigler, PA-C  ibuprofen (ADVIL,MOTRIN) 200 MG tablet Take 200 mg by mouth every 6 (six) hours as needed for fever, headache, mild pain, moderate pain or cramping.   Yes Historical Provider, MD  naproxen (  NAPROSYN) 250 MG tablet Take 250 mg by mouth 2 (two) times daily with a meal.    Yes Historical Provider, MD  naproxen (NAPROSYN) 500 MG tablet Take 1 tablet (500 mg total) by mouth 2 (two) times daily with a meal. 11/29/14  Yes Arthor Captain, PA-C  oxyCODONE-acetaminophen (PERCOCET) 10-325 MG tablet Take 1 tablet by mouth every 6 (six) hours as needed for pain.  12/05/14  Yes Historical Provider, MD   ondansetron (ZOFRAN ODT) 4 MG disintegrating tablet Take 1 tablet (4 mg total) by mouth every 8 (eight) hours as needed for nausea or vomiting. 12/05/14   Lanson Randle, PA-C  ondansetron (ZOFRAN) 4 MG tablet Take 1 tablet (4 mg total) by mouth every 8 (eight) hours as needed for nausea or vomiting. Patient not taking: Reported on 12/05/2014 11/29/14   Arthor Captain, PA-C  oxyCODONE-acetaminophen (PERCOCET/ROXICET) 5-325 MG tablet Take 1-2 tablets by mouth every 6 (six) hours as needed for severe pain. Patient not taking: Reported on 12/05/2014 12/03/14   Renne Crigler, PA-C  PEG 3350-KCl-NaBcb-NaCl-NaSulf (PEG 3350/ELECTROLYTES) 240 G SOLR  12/05/14   Historical Provider, MD   BP 148/85 mmHg  Pulse 84  Temp(Src) 99.7 F (37.6 C) (Oral)  Resp 20  SpO2 96% Physical Exam  Constitutional: He appears well-developed and well-nourished. No distress.  HENT:  Head: Normocephalic and atraumatic.  Eyes: Conjunctivae are normal. Right eye exhibits no discharge. Left eye exhibits no discharge. No scleral icterus.  Neck: Normal range of motion.  Cardiovascular: Normal rate, regular rhythm and normal heart sounds.   Pulmonary/Chest: Effort normal and breath sounds normal. No respiratory distress. He has no wheezes. He has no rales.  Abdominal: Soft. Bowel sounds are normal. He exhibits no distension. There is tenderness (generalized). There is no rebound and no guarding.  Musculoskeletal: Normal range of motion.  Neurological: He is alert. Coordination normal.  Skin: Skin is warm and dry.  Psychiatric: He has a normal mood and affect. His behavior is normal.  Nursing note and vitals reviewed.   ED Course  Procedures (including critical care time) Labs Review Labs Reviewed - No data to display  Imaging Review No results found. I have personally reviewed and evaluated these images and lab results as part of my medical decision-making.   EKG Interpretation None      MDM   Final  diagnoses:  Generalized abdominal pain   Pt presenting with abdominal pain. Pt has been seen by this ED and GI doctor in [facility] for same complaint. He had HIDA scan showing biliary dyskinesia earlier today and is scheduled for colonoscopy and EGD in 3 days. Symptoms are unchanged since their onset over a week ago. Associated with nausea but no vomiting. Pt's goal of today's visit is pain control. Pt specifically asking for dilaudid and fentanyl. VSS. Afebrile. Pt is nontoxic appearing. Pt resting comfortably with headphones in during interview. Abdomen is soft with moderate generalized tenderness. No rebound or guarding. Symptoms controlled with zofran and dilaudid. Repeat abdominal exam shows mild generalized tenderness that is much improved since initial exam. No peritoneal signs. Pt states he is ready to go home but is requesting a dose of fentanyl "for the road". Pt does not appear to be in pain currently; discussed with pt that I will not prescribe further narcotics after pt's reported improvement in symptoms. Pt expresses understanding and says he would like to leave. Strict return precautions given in discharge paperwork and discussed with pt at bedside. Pt stable for discharge  Alveta Heimlich, PA-C 12/06/14 1139  Azalia Bilis, MD 12/08/14 1455

## 2014-12-05 NOTE — ED Notes (Signed)
PT c/o severe abd pain and dehydration; pt was seen here twice and was referred to GI; pt has an appt for a Colonoscopy and an endoscopy later in the week; pt was seen at [facility] this am for a gallbladder ERCP today and they advised that he was dehydrated and needed to go to the ER for IV Fluids; pt was at [facility] ER and sat in the waiting room for over 5hrs; pt begged his parents to bring him here for pain control and fluids

## 2014-12-05 NOTE — ED Notes (Signed)
Pt has IV in place from [facility]

## 2014-12-05 NOTE — Discharge Instructions (Signed)
Follow up with your GI doctor on Friday Use zofran for nausea  Abdominal Pain, Adult Many things can cause abdominal pain. Usually, abdominal pain is not caused by a disease and will improve without treatment. It can often be observed and treated at home. Your health care provider will do a physical exam and possibly order blood tests and X-rays to help determine the seriousness of your pain. However, in many cases, more time must pass before a clear cause of the pain can be found. Before that point, your health care provider may not know if you need more testing or further treatment. HOME CARE INSTRUCTIONS Monitor your abdominal pain for any changes. The following actions may help to alleviate any discomfort you are experiencing:  Only take over-the-counter or prescription medicines as directed by your health care provider.  Do not take laxatives unless directed to do so by your health care provider.  Try a clear liquid diet (broth, tea, or water) as directed by your health care provider. Slowly move to a bland diet as tolerated. SEEK MEDICAL CARE IF:  You have unexplained abdominal pain.  You have abdominal pain associated with nausea or diarrhea.  You have pain when you urinate or have a bowel movement.  You experience abdominal pain that wakes you in the night.  You have abdominal pain that is worsened or improved by eating food.  You have abdominal pain that is worsened with eating fatty foods.  You have a fever. SEEK IMMEDIATE MEDICAL CARE IF:  Your pain does not go away within 2 hours.  You keep throwing up (vomiting).  Your pain is felt only in portions of the abdomen, such as the right side or the left lower portion of the abdomen.  You pass bloody or black tarry stools. MAKE SURE YOU:  Understand these instructions.  Will watch your condition.  Will get help right away if you are not doing well or get worse.   This information is not intended to replace advice  given to you by your health care provider. Make sure you discuss any questions you have with your health care provider.   Document Released: 11/20/2004 Document Revised: 11/01/2014 Document Reviewed: 10/20/2012 Elsevier Interactive Patient Education [employer]

## 2014-12-07 ENCOUNTER — Emergency Department (HOSPITAL_COMMUNITY)
Admission: EM | Admit: 2014-12-07 | Discharge: 2014-12-07 | Disposition: A | Discharge status: Home / Self Care | Payer: BLUE CROSS/BLUE SHIELD | Attending: Emergency Medicine | Admitting: Emergency Medicine

## 2014-12-07 ENCOUNTER — Encounter (HOSPITAL_COMMUNITY): Payer: Self-pay | Admitting: *Deleted

## 2014-12-07 DIAGNOSIS — R112 Nausea with vomiting, unspecified: Secondary | ICD-10-CM | POA: Diagnosis present

## 2014-12-07 DIAGNOSIS — Z880 Allergy status to penicillin: Secondary | ICD-10-CM | POA: Insufficient documentation

## 2014-12-07 DIAGNOSIS — Z79899 Other long term (current) drug therapy: Secondary | ICD-10-CM | POA: Diagnosis not present

## 2014-12-07 DIAGNOSIS — Z872 Personal history of diseases of the skin and subcutaneous tissue: Secondary | ICD-10-CM | POA: Diagnosis not present

## 2014-12-07 DIAGNOSIS — Z8719 Personal history of other diseases of the digestive system: Secondary | ICD-10-CM | POA: Diagnosis not present

## 2014-12-07 DIAGNOSIS — Z862 Personal history of diseases of the blood and blood-forming organs and certain disorders involving the immune mechanism: Secondary | ICD-10-CM | POA: Diagnosis not present

## 2014-12-07 DIAGNOSIS — R1084 Generalized abdominal pain: Secondary | ICD-10-CM | POA: Insufficient documentation

## 2014-12-07 DIAGNOSIS — Z791 Long term (current) use of non-steroidal anti-inflammatories (NSAID): Secondary | ICD-10-CM | POA: Insufficient documentation

## 2014-12-07 DIAGNOSIS — R1110 Vomiting, unspecified: Secondary | ICD-10-CM

## 2014-12-07 MED ORDER — KETOROLAC TROMETHAMINE 30 MG/ML IJ SOLN
30.0000 mg | Freq: Once | INTRAMUSCULAR | Status: AC
  Administered 2014-12-07: 30 mg via INTRAVENOUS
  Filled 2014-12-07: qty 1

## 2014-12-07 MED ORDER — PANTOPRAZOLE SODIUM 40 MG IV SOLR
40.0000 mg | Freq: Once | INTRAVENOUS | Status: AC
  Administered 2014-12-07: 40 mg via INTRAVENOUS
  Filled 2014-12-07: qty 40

## 2014-12-07 MED ORDER — ONDANSETRON HCL 4 MG/2ML IJ SOLN
4.0000 mg | Freq: Once | INTRAMUSCULAR | Status: AC
  Administered 2014-12-07: 4 mg via INTRAVENOUS
  Filled 2014-12-07: qty 2

## 2014-12-07 MED ORDER — SODIUM CHLORIDE 0.9 % IV BOLUS (SEPSIS)
1000.0000 mL | Freq: Once | INTRAVENOUS | Status: AC
  Administered 2014-12-07: 1000 mL via INTRAVENOUS

## 2014-12-07 NOTE — ED Notes (Signed)
Pt has had abdominal pain x 1 week with several tests previously obtained. PT has appt at 1300 tomorrow at [facility] for endoscopy and colonoscopy. Vomited today for the first time since pain occurred. Mother states she noticed blood in the emesis.

## 2014-12-07 NOTE — Discharge Instructions (Signed)
Taking Zofran for nausea and follow-up with your colonoscopy tomorrow as scheduled.

## 2014-12-09 NOTE — ED Provider Notes (Signed)
CSN: [id number]     Arrival date & time 12/07/14  1741 History   First MD Initiated Contact with Patient 12/07/14 1816     Chief Complaint  Patient presents with  . Hematemesis  . Abdominal Pain     (Consider location/radiation/quality/duration/timing/severity/associated sxs/prior Treatment) Patient is a 19 y.o. male presenting with abdominal pain. The history is provided by the patient (And has had some nausea and vomiting today. Patient has been seen numerous times with this vomiting he has a GI doctor at [facility] and is scheduled for colonoscopy tomorrow.).  Abdominal Pain Pain location:  Generalized Pain quality: aching   Pain radiates to:  Does not radiate Onset quality:  Sudden Timing:  Intermittent Progression:  Waxing and waning Associated symptoms: nausea   Associated symptoms: no chest pain, no cough, no diarrhea, no fatigue and no hematuria     Past Medical History  Diagnosis Date  . Acne   . GERD (gastroesophageal reflux disease)   . HSP (Henoch Schonlein purpura) (HCC)    History reviewed. No pertinent past surgical history. No family history on file. Social History  Substance Use Topics  . Smoking status: Never Smoker   . Smokeless tobacco: None  . Alcohol Use: Yes     Comment: rarely    Review of Systems  Constitutional: Negative for appetite change and fatigue.  HENT: Negative for congestion, ear discharge and sinus pressure.   Eyes: Negative for discharge.  Respiratory: Negative for cough.   Cardiovascular: Negative for chest pain.  Gastrointestinal: Positive for nausea and abdominal pain. Negative for diarrhea.  Genitourinary: Negative for frequency and hematuria.  Musculoskeletal: Negative for back pain.  Skin: Negative for rash.  Neurological: Negative for seizures and headaches.  Psychiatric/Behavioral: Negative for hallucinations.      Allergies  Penicillins  Home Medications   Prior to Admission medications   Medication Sig Start  Date End Date Taking? Authorizing Provider  dicyclomine (BENTYL) 20 MG tablet Take 1 tablet (20 mg total) by mouth 2 (two) times daily. 11/29/14  Yes Arthor Captain, PA-C  glucosamine-chondroitin 500-400 MG tablet Take 1 tablet by mouth daily.   Yes Historical Provider, MD  HYDROcodone-acetaminophen (NORCO) 5-325 MG tablet Take 1 tablet by mouth every 6 (six) hours as needed for severe pain. 11/29/14  Yes Arthor Captain, PA-C  HYDROmorphone (DILAUDID) 2 MG tablet Take 1 tablet (2 mg total) by mouth every 4 (four) hours as needed for severe pain. 12/03/14  Yes Renne Crigler, PA-C  ondansetron (ZOFRAN) 4 MG tablet Take 1 tablet (4 mg total) by mouth every 8 (eight) hours as needed for nausea or vomiting. 11/29/14  Yes Arthor Captain, PA-C  oxyCODONE-acetaminophen (PERCOCET) 10-325 MG tablet Take 1 tablet by mouth every 6 (six) hours as needed for pain.  12/05/14  Yes Historical Provider, MD  ibuprofen (ADVIL,MOTRIN) 200 MG tablet Take 200 mg by mouth every 6 (six) hours as needed for fever, headache, mild pain, moderate pain or cramping.    Historical Provider, MD  naproxen (NAPROSYN) 500 MG tablet Take 1 tablet (500 mg total) by mouth 2 (two) times daily with a meal. 11/29/14   Arthor Captain, PA-C  ondansetron (ZOFRAN ODT) 4 MG disintegrating tablet Take 1 tablet (4 mg total) by mouth every 8 (eight) hours as needed for nausea or vomiting. Patient not taking: Reported on 12/07/2014 12/05/14   Rolm Gala Barrett, PA-C  oxyCODONE-acetaminophen (PERCOCET/ROXICET) 5-325 MG tablet Take 1-2 tablets by mouth every 6 (six) hours as needed for severe pain.  Patient not taking: Reported on 12/05/2014 12/03/14   Renne CriglerJoshua Geiple, PA-C  PEG 3350-KCl-NaBcb-NaCl-NaSulf (PEG 3350/ELECTROLYTES) 240 G SOLR  12/05/14   Historical Provider, MD   BP 125/103 mmHg  Pulse 90  Temp(Src) 98.6 F (37 C) (Oral)  Resp 18  Ht 6\' 1"  (1.854 m)  Wt 192 lb (87.091 kg)  BMI 25.34 kg/m2  SpO2 100% Physical Exam  Constitutional: He is  oriented to person, place, and time. He appears well-developed.  HENT:  Head: Normocephalic.  Eyes: Conjunctivae and EOM are normal. No scleral icterus.  Neck: Neck supple. No thyromegaly present.  Cardiovascular: Normal rate and regular rhythm.  Exam reveals no gallop and no friction rub.   No murmur heard. Pulmonary/Chest: No stridor. He has no wheezes. He has no rales. He exhibits no tenderness.  Abdominal: He exhibits no distension. There is no tenderness. There is no rebound.  Musculoskeletal: Normal range of motion. He exhibits no edema.  Lymphadenopathy:    He has no cervical adenopathy.  Neurological: He is oriented to person, place, and time. He exhibits normal muscle tone. Coordination normal.  Skin: No rash noted. No erythema.  Psychiatric: He has a normal mood and affect. His behavior is normal.    ED Course  Procedures (including critical care time) Labs Review Labs Reviewed - No data to display  Imaging Review No results found. I have personally reviewed and evaluated these images and lab results as part of my medical decision-making.   EKG Interpretation None      MDM   Final diagnoses:  Acute vomiting    Nausea vomiting has improved with Toradol and Zofran and fluids. Patient does not want to stay in the emergency room any longer. He wants to go home and take his prep for colonoscopy tomorrow. Patient does not want any more blood work done. Since patient is feeling better look stable I agree to said he discharged    Bethann BerkshireJoseph Jenya Putz, MD 12/09/14 1009

## 2015-08-01 ENCOUNTER — Other Ambulatory Visit: Payer: Self-pay | Admitting: Sports Medicine

## 2015-08-01 DIAGNOSIS — S8991XA Unspecified injury of right lower leg, initial encounter: Secondary | ICD-10-CM

## 2015-08-02 ENCOUNTER — Ambulatory Visit
Admission: RE | Admit: 2015-08-02 | Discharge: 2015-08-02 | Disposition: A | Discharge status: Home / Self Care | Payer: BLUE CROSS/BLUE SHIELD | Source: Ambulatory Visit | Attending: Sports Medicine | Admitting: Sports Medicine

## 2015-08-02 DIAGNOSIS — S8991XA Unspecified injury of right lower leg, initial encounter: Secondary | ICD-10-CM

## 2015-12-11 ENCOUNTER — Emergency Department (HOSPITAL_COMMUNITY)
Admission: EM | Admit: 2015-12-11 | Discharge: 2015-12-11 | Disposition: A | Discharge status: Home / Self Care | Payer: BLUE CROSS/BLUE SHIELD | Attending: Emergency Medicine | Admitting: Emergency Medicine

## 2015-12-11 ENCOUNTER — Encounter (HOSPITAL_COMMUNITY): Payer: Self-pay | Admitting: *Deleted

## 2015-12-11 DIAGNOSIS — R002 Palpitations: Secondary | ICD-10-CM

## 2015-12-11 DIAGNOSIS — Z79899 Other long term (current) drug therapy: Secondary | ICD-10-CM | POA: Insufficient documentation

## 2015-12-11 DIAGNOSIS — R000 Tachycardia, unspecified: Secondary | ICD-10-CM | POA: Diagnosis not present

## 2015-12-11 DIAGNOSIS — F909 Attention-deficit hyperactivity disorder, unspecified type: Secondary | ICD-10-CM | POA: Diagnosis not present

## 2015-12-11 HISTORY — DX: Attention-deficit hyperactivity disorder, unspecified type: F90.9

## 2015-12-11 NOTE — ED Provider Notes (Signed)
[facility]-EMERGENCY DEPT Provider Note   CSN: [id number] Arrival date & time: 12/11/15  1534     History   Chief Complaint Chief Complaint  Patient presents with  . Tachycardia    HPI Gregory Conway is a 20 y.o. male.  The history is provided by the patient.   CC: tachycardia  Onset/Duration: 5 hours PTA Timing: constant, improved Severity: moderate Modifying Factors:  Improved by: self resolved  Worsened by: anxiety Associated Signs/Symptoms:  Pertinent (+): light-headed   Pertinent (-): chest pain, SOB, n/v/d, no recent illness or infection. No headache, blurry vision. Context: pt with h/o ADD on Focalin for 2 weeks. 1st time treatment for ADD. Started with 1 pill daily and then increased to 2 pills daily 4 days ago. Take one pill in the am and then 2nd 4 hours later. sxs started 2 hours after 2nd dose today.   Past Medical History:  Diagnosis Date  . Acne   . ADHD   . GERD (gastroesophageal reflux disease)   . HSP (Henoch Schonlein purpura) (HCC)     There are no active problems to display for this patient.   History reviewed. No pertinent surgical history.     Home Medications    Prior to Admission medications   Medication Sig Start Date End Date Taking? Authorizing Provider  dexmethylphenidate (FOCALIN) 10 MG tablet Take 10 mg by mouth 2 (two) times daily.   Yes Historical Provider, MD  Multiple Vitamins-Minerals (MULTIVITAMIN ADULT) TABS Take 1 tablet by mouth every evening.   Yes Historical Provider, MD  dicyclomine (BENTYL) 20 MG tablet Take 1 tablet (20 mg total) by mouth 2 (two) times daily. Patient not taking: Reported on 12/11/2015 11/29/14   Arthor Captain, PA-C  HYDROcodone-acetaminophen (NORCO) 5-325 MG tablet Take 1 tablet by mouth every 6 (six) hours as needed for severe pain. Patient not taking: Reported on 12/11/2015 11/29/14   Arthor Captain, PA-C  HYDROmorphone (DILAUDID) 2 MG tablet Take 1 tablet (2 mg total) by mouth every 4 (four) hours as  needed for severe pain. Patient not taking: Reported on 12/11/2015 12/03/14   Renne Crigler, PA-C  naproxen (NAPROSYN) 500 MG tablet Take 1 tablet (500 mg total) by mouth 2 (two) times daily with a meal. Patient not taking: Reported on 12/11/2015 11/29/14   Arthor Captain, PA-C  ondansetron (ZOFRAN ODT) 4 MG disintegrating tablet Take 1 tablet (4 mg total) by mouth every 8 (eight) hours as needed for nausea or vomiting. Patient not taking: Reported on 12/11/2015 12/05/14   Stevi Barrett, PA-C  ondansetron (ZOFRAN) 4 MG tablet Take 1 tablet (4 mg total) by mouth every 8 (eight) hours as needed for nausea or vomiting. Patient not taking: Reported on 12/11/2015 11/29/14   Arthor Captain, PA-C  oxyCODONE-acetaminophen (PERCOCET/ROXICET) 5-325 MG tablet Take 1-2 tablets by mouth every 6 (six) hours as needed for severe pain. Patient not taking: Reported on 12/11/2015 12/03/14   Renne Crigler, PA-C    Family History No family history on file.  Social History Social History  Substance Use Topics  . Smoking status: Never Smoker  . Smokeless tobacco: Never Used  . Alcohol use Yes     Comment: rarely     Allergies   Penicillins   Review of Systems Review of Systems Ten systems are reviewed and are negative for acute change except as noted in the HPI   Physical Exam Updated Vital Signs BP 122/92 (BP Location: Left Arm)   Pulse 102   Temp 98.8 F (37.1  C) (Oral)   Resp 19   SpO2 100%   Physical Exam  Constitutional: He is oriented to person, place, and time. He appears well-developed and well-nourished. No distress.  HENT:  Head: Normocephalic and atraumatic.  Nose: Nose normal.  Eyes: Conjunctivae and EOM are normal. Pupils are equal, round, and reactive to light. Right eye exhibits no discharge. Left eye exhibits no discharge. No scleral icterus.  Neck: Normal range of motion. Neck supple.  Cardiovascular: Normal rate and regular rhythm.  Exam reveals no gallop and no friction  rub.   No murmur heard. Pulmonary/Chest: Effort normal and breath sounds normal. No stridor. No respiratory distress. He has no rales.  Abdominal: Soft. He exhibits no distension. There is no tenderness.  Musculoskeletal: He exhibits no edema or tenderness.  Neurological: He is alert and oriented to person, place, and time.  Skin: Skin is warm and dry. No rash noted. He is not diaphoretic. No erythema.  Psychiatric: He has a normal mood and affect.  Vitals reviewed.    ED Treatments / Results  Labs (all labs ordered are listed, but only abnormal results are displayed) Labs Reviewed - No data to display  EKG  EKG Interpretation None       Radiology No results found.  Procedures Procedures (including critical care time)  Medications Ordered in ED Medications - No data to display   Initial Impression / Assessment and Plan / ED Course  I have reviewed the triage vital signs and the nursing notes.  Pertinent labs & imaging results that were available during my care of the patient were reviewed by me and considered in my medical decision making (see chart for details).  Clinical Course    Likely Focalin side effect. Low suspicion for PE. 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   The patient is safe for discharge with strict return precautions.   Final Clinical Impressions(s) / ED Diagnoses   Final diagnoses:  Palpitations  Sinus tachycardia   Disposition: Discharge  Condition: Good  I have discussed the results, Dx and Tx plan with the patient who expressed understanding and agree(s) with the plan. Discharge instructions discussed at great length. The patient was given strict return precautions who verbalized understanding of the instructions. No further questions at time of discharge.    Discharge Medication List as of 12/11/2015  5:44 PM      Follow Up: Primary care provider   For close follow up to assess for Focalin dose adjustment      Nira ConnPedro  Eduardo Cardama, MD 12/12/15 [phone]

## 2015-12-11 NOTE — ED Triage Notes (Signed)
Patient states 2 weeks ago he started taking Focalin for ADHD.  He states he was doing well on the medication until today @11 :30, when he noticed his heart was racing.  Patient is alert & oriented, but feels "shaky."  Patient denies pain and SOB.  Patient's HR in triage, 133 - 136.

## 2015-12-11 NOTE — ED Notes (Signed)
Bed: [facility] Expected date:  Expected time:  Means of arrival:  Comments: 

## 2015-12-11 NOTE — ED Notes (Signed)
MD at bedside. 

## 2016-02-12 ENCOUNTER — Other Ambulatory Visit: Payer: Self-pay | Admitting: Specialist

## 2016-02-12 DIAGNOSIS — M2400 Loose body in unspecified joint: Secondary | ICD-10-CM

## 2016-05-11 ENCOUNTER — Encounter (HOSPITAL_COMMUNITY): Payer: Self-pay

## 2016-05-11 ENCOUNTER — Emergency Department (HOSPITAL_COMMUNITY): Payer: BLUE CROSS/BLUE SHIELD

## 2016-05-11 ENCOUNTER — Emergency Department (HOSPITAL_COMMUNITY)
Admission: EM | Admit: 2016-05-11 | Discharge: 2016-05-11 | Disposition: A | Discharge status: Home / Self Care | Payer: BLUE CROSS/BLUE SHIELD | Attending: Emergency Medicine | Admitting: Emergency Medicine

## 2016-05-11 DIAGNOSIS — Z79899 Other long term (current) drug therapy: Secondary | ICD-10-CM | POA: Insufficient documentation

## 2016-05-11 DIAGNOSIS — F909 Attention-deficit hyperactivity disorder, unspecified type: Secondary | ICD-10-CM | POA: Diagnosis not present

## 2016-05-11 DIAGNOSIS — R112 Nausea with vomiting, unspecified: Secondary | ICD-10-CM | POA: Diagnosis not present

## 2016-05-11 DIAGNOSIS — R1030 Lower abdominal pain, unspecified: Secondary | ICD-10-CM | POA: Diagnosis present

## 2016-05-11 DIAGNOSIS — R197 Diarrhea, unspecified: Secondary | ICD-10-CM | POA: Diagnosis not present

## 2016-05-11 DIAGNOSIS — R1084 Generalized abdominal pain: Secondary | ICD-10-CM | POA: Insufficient documentation

## 2016-05-11 LAB — URINALYSIS, ROUTINE W REFLEX MICROSCOPIC
BILIRUBIN URINE: NEGATIVE
Glucose, UA: NEGATIVE mg/dL
Hgb urine dipstick: NEGATIVE
KETONES UR: NEGATIVE mg/dL
Leukocytes, UA: NEGATIVE
NITRITE: NEGATIVE
PROTEIN: NEGATIVE mg/dL
SPECIFIC GRAVITY, URINE: 1.015 (ref 1.005–1.030)
pH: 7 (ref 5.0–8.0)

## 2016-05-11 LAB — COMPREHENSIVE METABOLIC PANEL
ALK PHOS: 70 U/L (ref 38–126)
ALT: 17 U/L (ref 17–63)
ANION GAP: 8 (ref 5–15)
AST: 22 U/L (ref 15–41)
Albumin: 4.4 g/dL (ref 3.5–5.0)
BUN: 14 mg/dL (ref 6–20)
CALCIUM: 9.2 mg/dL (ref 8.9–10.3)
CO2: 25 mmol/L (ref 22–32)
Chloride: 104 mmol/L (ref 101–111)
Creatinine, Ser: 1.1 mg/dL (ref 0.61–1.24)
GFR calc non Af Amer: >60 mL/min (ref >60)
GLUCOSE: 93 mg/dL (ref 65–99)
Potassium: 3.7 mmol/L (ref 3.5–5.1)
Sodium: 137 mmol/L (ref 135–145)
TOTAL PROTEIN: 7.3 g/dL (ref 6.5–8.1)
Total Bilirubin: 1.7 mg/dL — ABNORMAL HIGH (ref 0.3–1.2)

## 2016-05-11 LAB — CBC
HCT: 40.2 % (ref 39.0–52.0)
HEMOGLOBIN: 14.2 g/dL (ref 13.0–17.0)
MCH: 31.1 pg (ref 26.0–34.0)
MCHC: 35.3 g/dL (ref 30.0–36.0)
MCV: 88 fL (ref 78.0–100.0)
PLATELETS: 180 10*3/uL (ref 150–400)
RBC: 4.57 MIL/uL (ref 4.22–5.81)
RDW: 12.3 % (ref 11.5–15.5)
WBC: 5.4 10*3/uL (ref 4.0–10.5)

## 2016-05-11 LAB — LIPASE, BLOOD: Lipase: 26 U/L (ref 11–51)

## 2016-05-11 LAB — POC OCCULT BLOOD, ED: FECAL OCCULT BLD: NEGATIVE

## 2016-05-11 MED ORDER — HYDROMORPHONE HCL 1 MG/ML IJ SOLN
1.0000 mg | Freq: Once | INTRAMUSCULAR | Status: AC
  Administered 2016-05-11: 1 mg via INTRAVENOUS
  Filled 2016-05-11: qty 1

## 2016-05-11 MED ORDER — ONDANSETRON HCL 4 MG/2ML IJ SOLN
4.0000 mg | Freq: Once | INTRAMUSCULAR | Status: AC
  Administered 2016-05-11: 4 mg via INTRAVENOUS
  Filled 2016-05-11: qty 2

## 2016-05-11 MED ORDER — IOPAMIDOL (ISOVUE-300) INJECTION 61%
INTRAVENOUS | Status: AC
  Administered 2016-05-11: 100 mL
  Filled 2016-05-11: qty 100

## 2016-05-11 MED ORDER — SODIUM CHLORIDE 0.9 % IV SOLN
Freq: Once | INTRAVENOUS | Status: AC
  Administered 2016-05-11: 08:00:00 via INTRAVENOUS

## 2016-05-11 MED ORDER — SODIUM CHLORIDE 0.9 % IV BOLUS (SEPSIS)
1000.0000 mL | Freq: Once | INTRAVENOUS | Status: AC
  Administered 2016-05-11: 1000 mL via INTRAVENOUS

## 2016-05-11 MED ORDER — DICYCLOMINE HCL 10 MG PO CAPS
10.0000 mg | ORAL_CAPSULE | Freq: Once | ORAL | Status: AC
  Administered 2016-05-11: 10 mg via ORAL
  Filled 2016-05-11: qty 1

## 2016-05-11 MED ORDER — ONDANSETRON HCL 4 MG PO TABS: 4.0000 mg | ORAL_TABLET | Freq: Four times a day (QID) | ORAL | 0 refills | Status: DC

## 2016-05-11 MED ORDER — MORPHINE SULFATE (PF) 4 MG/ML IV SOLN: 4.0000 mg | Freq: Once | INTRAVENOUS | Status: DC

## 2016-05-11 MED ORDER — SODIUM CHLORIDE 0.9 % IV BOLUS (SEPSIS)
500.0000 mL | Freq: Once | INTRAVENOUS | Status: AC
  Administered 2016-05-11: 500 mL via INTRAVENOUS

## 2016-05-11 MED ORDER — TRAMADOL HCL 50 MG PO TABS: 50.0000 mg | ORAL_TABLET | Freq: Four times a day (QID) | ORAL | 0 refills | Status: DC | PRN

## 2016-05-11 MED ORDER — FENTANYL CITRATE (PF) 100 MCG/2ML IJ SOLN
50.0000 ug | Freq: Once | INTRAMUSCULAR | Status: AC
  Administered 2016-05-11: 50 ug via INTRAVENOUS
  Filled 2016-05-11: qty 2

## 2016-05-11 NOTE — ED Triage Notes (Signed)
Abdominal pain with nausea and vomiting with fever at home for 4 days worse PTA with diarrhea.

## 2016-05-11 NOTE — Discharge Instructions (Signed)
Please follow up with your primary care provider and rheumatologist for further evaluation of your abdominal pain.  Dr. Rich BraveGiattino's office will contact you sometimes this week for close follow up.  Return if you have any concerns.

## 2016-05-11 NOTE — ED Provider Notes (Signed)
[facility]-EMERGENCY DEPT Provider Note   CSN: [id number] Arrival date & time: 05/11/16  0541     History   Chief Complaint Chief Complaint  Patient presents with  . Abdominal Pain    HPI Gregory Conway is a 21 y.o. male.  HPI   21 year old male with history of GERD, HSP, ADHD presenting complaining of abdominal pain. Patient reports gradual onset of sharp lower abdominal pain for the past 3-4 days. Pain is waxing waning but has gotten progressively worse. Although eating doesn't increased pain, he is avoiding eating due to having persistent nausea, has had vomited multiple times as well as having loose stools. Denies any associated blood with his emesis or his stool. Report subjective fever on the first day but none since. Patient has an intact appendix but does not feel this is related to his appendix.  He initially was seen at urgent care for this condition several days prior but was told that he may have a stomach bug and was discharge. His pain has not improved. He also notice a faint rash that is nontender and non pruritus which appears in his right medial thigh. Patient currently denies having headache, chest pain, shortness of breath, productive cough, dysuria, hematuria, penile pain, scrotal pain, hematochezia or melena. Patient mentioned he has similar presentation like this in the past and [facility].  Past Medical History:  Diagnosis Date  . Acne   . ADHD   . GERD (gastroesophageal reflux disease)   . HSP (Henoch Schonlein purpura) (HCC)     There are no active problems to display for this patient.   History reviewed. No pertinent surgical history.     Home Medications    Prior to Admission medications   Medication Sig Start Date End Date Taking? Authorizing Provider  dexmethylphenidate (FOCALIN) 10 MG tablet Take 10 mg by mouth 2 (two) times daily.    Historical Provider, MD  dicyclomine (BENTYL) 20 MG tablet Take 1 tablet (20 mg total) by  mouth 2 (two) times daily. Patient not taking: Reported on 12/11/2015 11/29/14   Arthor CaptainAbigail Harris, PA-C  HYDROcodone-acetaminophen (NORCO) 5-325 MG tablet Take 1 tablet by mouth every 6 (six) hours as needed for severe pain. Patient not taking: Reported on 12/11/2015 11/29/14   Arthor CaptainAbigail Harris, PA-C  HYDROmorphone (DILAUDID) 2 MG tablet Take 1 tablet (2 mg total) by mouth every 4 (four) hours as needed for severe pain. Patient not taking: Reported on 12/11/2015 12/03/14   Renne CriglerJoshua Geiple, PA-C  Multiple Vitamins-Minerals (MULTIVITAMIN ADULT) TABS Take 1 tablet by mouth every evening.    Historical Provider, MD  naproxen (NAPROSYN) 500 MG tablet Take 1 tablet (500 mg total) by mouth 2 (two) times daily with a meal. Patient not taking: Reported on 12/11/2015 11/29/14   Arthor CaptainAbigail Harris, PA-C  ondansetron (ZOFRAN ODT) 4 MG disintegrating tablet Take 1 tablet (4 mg total) by mouth every 8 (eight) hours as needed for nausea or vomiting. Patient not taking: Reported on 12/11/2015 12/05/14   Stevi Barrett, PA-C  ondansetron (ZOFRAN) 4 MG tablet Take 1 tablet (4 mg total) by mouth every 8 (eight) hours as needed for nausea or vomiting. Patient not taking: Reported on 12/11/2015 11/29/14   Arthor CaptainAbigail Harris, PA-C  oxyCODONE-acetaminophen (PERCOCET/ROXICET) 5-325 MG tablet Take 1-2 tablets by mouth every 6 (six) hours as needed for severe pain. Patient not taking: Reported on 12/11/2015 12/03/14   Renne CriglerJoshua Geiple, PA-C    Family History History reviewed. No pertinent family history.  Social History Social History  Substance Use Topics  . Smoking status: Never Smoker  . Smokeless tobacco: Never Used  . Alcohol use Yes     Comment: rarely     Allergies   Penicillins   Review of Systems Review of Systems  All other systems reviewed and are negative.    Physical Exam Updated Vital Signs BP 119/80 (BP Location: Left Arm)   Pulse 74   Temp 97.8 F (36.6 C) (Oral)   Resp 18   SpO2 98%   Physical Exam    Constitutional: He appears well-developed and well-nourished. No distress.  Well appearing laying in bed appears uncomfortable but nontoxic  HENT:  Head: Atraumatic.  Mouth/Throat: Oropharynx is clear and moist.  Eyes: Conjunctivae are normal.  Neck: Normal range of motion. Neck supple.  Cardiovascular: Normal rate and regular rhythm.   Pulmonary/Chest: Effort normal and breath sounds normal.  Abdominal: Soft. He exhibits no distension. There is tenderness (Diffuse abdominal tenderness without focal point tenderness. Negative Murphy sign, no pain at McBurney's point.).  Neurological: He is alert.  Skin: No rash (Faint vascullitic salmon color rash noted to right medial thigh, non-blanchable, nonpalpable) noted.  Psychiatric: He has a normal mood and affect.  Nursing note and vitals reviewed.    ED Treatments / Results  Labs (all labs ordered are listed, but only abnormal results are displayed) Labs Reviewed  COMPREHENSIVE METABOLIC PANEL - Abnormal; Notable for the following:       Result Value   Total Bilirubin 1.7 (*)    All other components within normal limits  LIPASE, BLOOD  CBC  URINALYSIS, ROUTINE W REFLEX MICROSCOPIC  POC OCCULT BLOOD, ED    EKG  EKG Interpretation None       Radiology Ct Abdomen Pelvis W Contrast  Result Date: 05/11/2016 CLINICAL DATA:  21 year old male with abdominal pain, nausea, vomiting diarrhea and fever EXAM: CT ABDOMEN AND PELVIS WITH CONTRAST TECHNIQUE: Multidetector CT imaging of the abdomen and pelvis was performed using the standard protocol following bolus administration of intravenous contrast. CONTRAST:  ISOVUE-300 IOPAMIDOL (ISOVUE-300) INJECTION 61% COMPARISON:  Prior CT abdomen/pelvis 11/29/2014 FINDINGS: Lower chest: The lung bases are clear. Visualized cardiac structures are within normal limits for size. No pericardial effusion. Unremarkable visualized distal thoracic esophagus. Hepatobiliary: Normal hepatic contour and  morphology. No discrete hepatic lesions. Normal appearance of the gallbladder. No intra or extrahepatic biliary ductal dilatation. Pancreas: Unremarkable. No pancreatic ductal dilatation or surrounding inflammatory changes. Spleen: Normal in size without focal abnormality. Adrenals/Urinary Tract: Adrenal glands are unremarkable. Kidneys are normal, without renal calculi, focal lesion, or hydronephrosis. Bladder is unremarkable. Stomach/Bowel: No evidence of obstruction or focal bowel wall thickening. Normal appendix in the right lower quadrant. The terminal ileum is unremarkable. Vascular/Lymphatic: No significant vascular findings are present. No enlarged abdominal or pelvic lymph nodes. Reproductive: Prostate is unremarkable. Other: No abdominal wall hernia or abnormality. No abdominopelvic ascites. Musculoskeletal: No acute fracture or aggressive appearing lytic or blastic osseous lesion. IMPRESSION: Negative CT scan of the abdomen and pelvis.  No acute abnormality. Electronically Signed   By: Malachy Moan M.D.   On: 05/11/2016 09:52    Procedures Procedures (including critical care time)  Medications Ordered in ED Medications  dicyclomine (BENTYL) capsule 10 mg (not administered)  HYDROmorphone (DILAUDID) injection 1 mg (1 mg Intravenous Given 05/11/16 0702)  ondansetron (ZOFRAN) injection 4 mg (4 mg Intravenous Given 05/11/16 0656)  0.9 %  sodium chloride infusion ( Intravenous Stopped 05/11/16 0927)  sodium chloride 0.9 % bolus 1,000 mL (0 mLs Intravenous Stopped 05/11/16 0815)  sodium chloride 0.9 % bolus 500 mL (0 mLs Intravenous Stopped 05/11/16 1039)  HYDROmorphone (DILAUDID) injection 1 mg (1 mg Intravenous Given 05/11/16 0927)  iopamidol (ISOVUE-300) 61 % injection (100 mLs  Contrast Given 05/11/16 0934)  fentaNYL (SUBLIMAZE) injection 50 mcg (50 mcg Intravenous Given 05/11/16 1136)     Initial Impression / Assessment and Plan / ED Course  I have reviewed the triage vital signs and the  nursing notes.  Pertinent labs & imaging results that were available during my care of the patient were reviewed by me and considered in my medical decision making (see chart for details).     BP 116/69 (BP Location: Right Arm)   Pulse (!) 56   Temp 98 F (36.7 C) (Oral)   Resp 16   SpO2 97%    Final Clinical Impressions(s) / ED Diagnoses   Final diagnoses:  Nausea vomiting and diarrhea  Generalized abdominal pain    New Prescriptions New Prescriptions   ONDANSETRON (ZOFRAN) 4 MG TABLET    Take 1 tablet (4 mg total) by mouth every 6 (six) hours.   TRAMADOL (ULTRAM) 50 MG TABLET    Take 1 tablet (50 mg total) by mouth every 6 (six) hours as needed.   6:56 AM Pt with hx of HSP here with abd pain and a faint vasculitic rash to R medial thigh.  Pt attributed his sxs to HSP.  Pt still have an intact appendix.  Will consider abd/pelvis CT scan, however pt has had multiple CT scans in the past.  Work up initiated.  Plan to manage sxs.    9:06 AM Patient's labs are reassuring, UA without any sensory of urinary tract infection, a negative fecal occult blood tests, lipase is normal, normal CMP. Mildly elevated total bili of 1.7. Patient has normal WBC. After receiving pain medication and IV fluid and it did help temporarily but now his pain has returned. We discussed options of CT scan and patient is aware that he has had multiple to scanning the past which does increase the risk of radiation exposure however patient would like to have a repeat CT scan. Will obtain an abdominal and pelvis CT scan and we'll continue to treat his symptoms. He denies any increased joint pain at this time.  12:27 PM I have consulted pt's on-call rheumatologist at [facility], Dr. Fayne Norrie who does not recommend starting steroid at this time. He recommend close outpt f/u with pt's doctor STephanie Giattino.  He will have the office to contact pt for f/u.  Otherwise, will provide sxs treatment for pt and strict  return precaution.     Fayrene Helper, PA-C 05/11/16 1228    Dione Booze, MD 05/11/16 [phone]

## 2016-05-20 ENCOUNTER — Emergency Department (HOSPITAL_COMMUNITY)
Admission: EM | Admit: 2016-05-20 | Discharge: 2016-05-20 | Disposition: A | Discharge status: Home / Self Care | Payer: BLUE CROSS/BLUE SHIELD | Attending: Emergency Medicine | Admitting: Emergency Medicine

## 2016-05-20 DIAGNOSIS — R1030 Lower abdominal pain, unspecified: Secondary | ICD-10-CM

## 2016-05-20 DIAGNOSIS — R112 Nausea with vomiting, unspecified: Secondary | ICD-10-CM

## 2016-05-20 DIAGNOSIS — F909 Attention-deficit hyperactivity disorder, unspecified type: Secondary | ICD-10-CM | POA: Insufficient documentation

## 2016-05-20 DIAGNOSIS — Z79899 Other long term (current) drug therapy: Secondary | ICD-10-CM | POA: Diagnosis not present

## 2016-05-20 LAB — URINALYSIS, ROUTINE W REFLEX MICROSCOPIC
BILIRUBIN URINE: NEGATIVE
Glucose, UA: NEGATIVE mg/dL
Hgb urine dipstick: NEGATIVE
KETONES UR: NEGATIVE mg/dL
Leukocytes, UA: NEGATIVE
NITRITE: NEGATIVE
Protein, ur: NEGATIVE mg/dL
SPECIFIC GRAVITY, URINE: 1.01 (ref 1.005–1.030)
pH: 7 (ref 5.0–8.0)

## 2016-05-20 LAB — CBC
HCT: 42.2 % (ref 39.0–52.0)
Hemoglobin: 15.5 g/dL (ref 13.0–17.0)
MCH: 32 pg (ref 26.0–34.0)
MCHC: 36.7 g/dL — ABNORMAL HIGH (ref 30.0–36.0)
MCV: 87 fL (ref 78.0–100.0)
PLATELETS: 186 10*3/uL (ref 150–400)
RBC: 4.85 MIL/uL (ref 4.22–5.81)
RDW: 12.5 % (ref 11.5–15.5)
WBC: 6.5 10*3/uL (ref 4.0–10.5)

## 2016-05-20 LAB — COMPREHENSIVE METABOLIC PANEL
ALT: 14 U/L — ABNORMAL LOW (ref 17–63)
AST: 19 U/L (ref 15–41)
Albumin: 4.8 g/dL (ref 3.5–5.0)
Alkaline Phosphatase: 79 U/L (ref 38–126)
Anion gap: 5 (ref 5–15)
BILIRUBIN TOTAL: 1.3 mg/dL — AB (ref 0.3–1.2)
BUN: 16 mg/dL (ref 6–20)
CALCIUM: 9.2 mg/dL (ref 8.9–10.3)
CHLORIDE: 106 mmol/L (ref 101–111)
CO2: 28 mmol/L (ref 22–32)
CREATININE: 1.19 mg/dL (ref 0.61–1.24)
Glucose, Bld: 64 mg/dL — ABNORMAL LOW (ref 65–99)
Potassium: 3.8 mmol/L (ref 3.5–5.1)
Sodium: 139 mmol/L (ref 135–145)
TOTAL PROTEIN: 7.4 g/dL (ref 6.5–8.1)

## 2016-05-20 LAB — LIPASE, BLOOD: LIPASE: 36 U/L (ref 11–51)

## 2016-05-20 MED ORDER — HYDROMORPHONE HCL 1 MG/ML IJ SOLN
0.5000 mg | Freq: Once | INTRAMUSCULAR | Status: AC
  Administered 2016-05-20: 0.5 mg via INTRAVENOUS
  Filled 2016-05-20: qty 0.5

## 2016-05-20 MED ORDER — METHYLPREDNISOLONE SODIUM SUCC 125 MG IJ SOLR
125.0000 mg | Freq: Once | INTRAMUSCULAR | Status: AC
  Administered 2016-05-20: 125 mg via INTRAVENOUS
  Filled 2016-05-20: qty 2

## 2016-05-20 MED ORDER — PROMETHAZINE HCL 25 MG/ML IJ SOLN
12.5000 mg | Freq: Once | INTRAMUSCULAR | Status: AC
  Administered 2016-05-20: 12.5 mg via INTRAVENOUS
  Filled 2016-05-20: qty 1

## 2016-05-20 MED ORDER — ONDANSETRON 4 MG PO TBDP: 4.0000 mg | ORAL_TABLET | Freq: Three times a day (TID) | ORAL | 0 refills | Status: DC | PRN

## 2016-05-20 MED ORDER — SODIUM CHLORIDE 0.9 % IV BOLUS (SEPSIS)
1000.0000 mL | Freq: Once | INTRAVENOUS | Status: AC
  Administered 2016-05-20: 1000 mL via INTRAVENOUS

## 2016-05-20 MED ORDER — HYDROCODONE-ACETAMINOPHEN 5-325 MG PO TABS: 1.0000 | ORAL_TABLET | ORAL | 0 refills | Status: DC | PRN

## 2016-05-20 MED ORDER — OXYCODONE-ACETAMINOPHEN 5-325 MG PO TABS
1.0000 | ORAL_TABLET | ORAL | Status: DC | PRN
  Administered 2016-05-20: 1 via ORAL
  Filled 2016-05-20: qty 1

## 2016-05-20 MED ORDER — HYDROMORPHONE HCL 1 MG/ML IJ SOLN
1.0000 mg | Freq: Once | INTRAMUSCULAR | Status: AC
  Administered 2016-05-20: 1 mg via INTRAVENOUS
  Filled 2016-05-20: qty 1

## 2016-05-20 MED ORDER — ONDANSETRON 4 MG PO TBDP
4.0000 mg | ORAL_TABLET | Freq: Once | ORAL | Status: AC | PRN
  Administered 2016-05-20: 4 mg via ORAL
  Filled 2016-05-20: qty 1

## 2016-05-20 MED ORDER — ONDANSETRON HCL 4 MG/2ML IJ SOLN
4.0000 mg | Freq: Once | INTRAMUSCULAR | Status: AC
  Administered 2016-05-20: 4 mg via INTRAVENOUS
  Filled 2016-05-20: qty 2

## 2016-05-20 NOTE — ED Provider Notes (Signed)
[facility]Y DEPT Provider Note   CSN: [id number] Arrival date & time: 05/20/16  1523     History   Chief Complaint Chief Complaint  Patient presents with  . Abdominal Pain    HPI Gregory Conway is a 21 y.o. male.  Patient with a history of Henoch Schonlein purpura, GERD, ADHD, presents with complaint of generalized abdominal pain, worse across lower abdomen. No fever, change in bowel movements. He reports symptoms are similar to previous flare ups of HSP [facility] which [facility]. He has had nausea and vomiting without hematemesis. Symptoms started 2 weeks ago and have gotten progressively worse. He has a pending appointment with [facility] this week but could not wait.    The history is provided by the patient. No language interpreter was used.  Abdominal Pain   Associated symptoms include nausea and vomiting. Pertinent negatives include fever and myalgias.    Past Medical History:  Diagnosis Date  . Acne   . ADHD   . GERD (gastroesophageal reflux disease)   . HSP (Henoch Schonlein purpura) (HCC)     There are no active problems to display for this patient.   No past surgical history on file.     Home Medications    Prior to Admission medications   Medication Sig Start Date End Date Taking? Authorizing Provider  acetaminophen (TYLENOL) 325 MG tablet Take 650 mg by mouth every 6 (six) hours as needed for mild pain.   Yes Historical Provider, MD  dexmethylphenidate (FOCALIN XR) 10 MG 24 hr capsule Take 10 mg by mouth daily.  04/03/16  Yes Historical Provider, MD  Multiple Vitamins-Minerals (MULTIVITAMIN ADULT) TABS Take 1 tablet by mouth every evening.   Yes Historical Provider, MD  ondansetron (ZOFRAN) 4 MG tablet Take 1 tablet (4 mg total) by mouth every 6 (six) hours. 05/11/16  Yes Fayrene HelperBowie Tran, PA-C  azaTHIOprine (IMURAN) 50 MG tablet Take 50 mg by mouth daily.  05/15/16   Historical Provider, MD  traMADol (ULTRAM) 50 MG tablet Take 1 tablet (50 mg total) by mouth  every 6 (six) hours as needed. Patient taking differently: Take 50 mg by mouth every 6 (six) hours as needed for moderate pain or severe pain.  05/11/16   Fayrene HelperBowie Tran, PA-C    Family History No family history on file.  Social History Social History  Substance Use Topics  . Smoking status: Never Smoker  . Smokeless tobacco: Never Used  . Alcohol use Yes     Comment: rarely     Allergies   Morphine and related and Penicillins   Review of Systems Review of Systems  Constitutional: Negative for chills and fever.  Respiratory: Negative.   Cardiovascular: Negative.   Gastrointestinal: Positive for abdominal pain, nausea and vomiting.  Genitourinary: Negative.   Musculoskeletal: Negative.  Negative for myalgias.  Skin: Negative.   Neurological: Negative.      Physical Exam Updated Vital Signs BP 109/69   Pulse (!) 58   Temp 98 F (36.7 C) (Oral)   Resp 18   SpO2 99%   Physical Exam  Constitutional: He is oriented to person, place, and time. He appears well-developed and well-nourished.  HENT:  Head: Normocephalic.  Neck: Normal range of motion. Neck supple.  Cardiovascular: Normal rate and regular rhythm.   Pulmonary/Chest: Effort normal and breath sounds normal.  Abdominal: Soft. Bowel sounds are normal. He exhibits no distension. There is tenderness (Diffuse tenderness, worse in lower abdomen.). There is no rebound and no  guarding.  Musculoskeletal: Normal range of motion. He exhibits no edema.  Neurological: He is alert and oriented to person, place, and time.  Skin: Skin is warm and dry. No rash noted.  Psychiatric: He has a normal mood and affect.     ED Treatments / Results  Labs (all labs ordered are listed, but only abnormal results are displayed) Labs Reviewed  COMPREHENSIVE METABOLIC PANEL - Abnormal; Notable for the following:       Result Value   Glucose, Bld 64 (*)    ALT 14 (*)    Total Bilirubin 1.3 (*)    All other components within normal  limits  CBC - Abnormal; Notable for the following:    MCHC 36.7 (*)    All other components within normal limits  LIPASE, BLOOD  CBC  URINALYSIS, ROUTINE W REFLEX MICROSCOPIC   Results for orders placed or performed during the hospital encounter of 05/20/16  Lipase, blood  Result Value Ref Range   Lipase 36 11 - 51 U/L  Comprehensive metabolic panel  Result Value Ref Range   Sodium 139 135 - 145 mmol/L   Potassium 3.8 3.5 - 5.1 mmol/L   Chloride 106 101 - 111 mmol/L   CO2 28 22 - 32 mmol/L   Glucose, Bld 64 (L) 65 - 99 mg/dL   BUN 16 6 - 20 mg/dL   Creatinine, Ser 1.61 0.61 - 1.24 mg/dL   Calcium 9.2 8.9 - 09.6 mg/dL   Total Protein 7.4 6.5 - 8.1 g/dL   Albumin 4.8 3.5 - 5.0 g/dL   AST 19 15 - 41 U/L   ALT 14 (L) 17 - 63 U/L   Alkaline Phosphatase 79 38 - 126 U/L   Total Bilirubin 1.3 (H) 0.3 - 1.2 mg/dL   GFR calc non Af Amer >60 >60 mL/min   GFR calc Af Amer >60 >60 mL/min   Anion gap 5 5 - 15  CBC  Result Value Ref Range   WBC INSTRUMENT ERROR REPORTED 4.0 - 10.5 K/uL   RBC INSTRUMENT ERROR REPORTED 4.22 - 5.81 MIL/uL   Hemoglobin INSTRUMENT ERROR REPORTED 13.0 - 17.0 g/dL   HCT INSTRUMENT ERROR REPORTED 39.0 - 52.0 %   MCV INSTRUMENT ERROR REPORTED 78.0 - 100.0 fL   MCH INSTRUMENT ERROR REPORTED 26.0 - 34.0 pg   MCHC INSTRUMENT ERROR REPORTED 30.0 - 36.0 g/dL   RDW INSTRUMENT ERROR REPORTED 11.5 - 15.5 %   Platelets INSTRUMENT ERROR REPORTED 150 - 400 K/uL  Urinalysis, Routine w reflex microscopic  Result Value Ref Range   Color, Urine YELLOW YELLOW   APPearance CLEAR CLEAR   Specific Gravity, Urine 1.010 1.005 - 1.030   pH 7.0 5.0 - 8.0   Glucose, UA NEGATIVE NEGATIVE mg/dL   Hgb urine dipstick NEGATIVE NEGATIVE   Bilirubin Urine NEGATIVE NEGATIVE   Ketones, ur NEGATIVE NEGATIVE mg/dL   Protein, ur NEGATIVE NEGATIVE mg/dL   Nitrite NEGATIVE NEGATIVE   Leukocytes, UA NEGATIVE NEGATIVE  CBC  Result Value Ref Range   WBC 6.5 4.0 - 10.5 K/uL   RBC 4.85 4.22  - 5.81 MIL/uL   Hemoglobin 15.5 13.0 - 17.0 g/dL   HCT 04.5 40.9 - 81.1 %   MCV 87.0 78.0 - 100.0 fL   MCH 32.0 26.0 - 34.0 pg   MCHC 36.7 (H) 30.0 - 36.0 g/dL   RDW 91.4 78.2 - 95.6 %   Platelets 186 150 - 400 K/uL    EKG  EKG Interpretation  None       Radiology No results found.  Procedures Procedures (including critical care time)  Medications Ordered in ED Medications  oxyCODONE-acetaminophen (PERCOCET/ROXICET) 5-325 MG per tablet 1 tablet (1 tablet Oral Given 05/20/16 1540)  sodium chloride 0.9 % bolus 1,000 mL (not administered)  ondansetron (ZOFRAN-ODT) disintegrating tablet 4 mg (4 mg Oral Given 05/20/16 1540)  HYDROmorphone (DILAUDID) injection 1 mg (1 mg Intravenous Given 05/20/16 1650)  promethazine (PHENERGAN) injection 12.5 mg (12.5 mg Intravenous Given 05/20/16 1647)  sodium chloride 0.9 % bolus 1,000 mL (0 mLs Intravenous Stopped 05/20/16 1723)     Initial Impression / Assessment and Plan / ED Course  I have reviewed the triage vital signs and the nursing notes.  Pertinent labs & imaging results that were available during my care of the patient were reviewed by me and considered in my medical decision making (see chart for details).     Patient with abdominal pain, N, V similar to previous flares of HSP. He has pending appointment at [facility] with rheumatologist.   IVF's provided with pain and nausea medications. On re-evaluation, the patient is feeling better. PO challenge and reassess. Chart reviewed. CT abd/pel done on 05/11/16 was unremarkable.   He is tolerating PO fluids. He is up to the bathroom and feels improved enough to go home. IV steroids provided. He has a taper dose pred-pack at home and is advised to take as directed.   Final Clinical Impressions(s) / ED Diagnoses   Final diagnoses:  None   1. Abdominal pain 2. Nausea and vomiting.  New Prescriptions New Prescriptions   No medications on file     Elpidio Anis, Cordelia Poche 05/21/16 2328      Jacalyn Lefevre, MD 05/22/16 [phone]

## 2016-05-20 NOTE — ED Triage Notes (Signed)
Pt states that he has HSP and feels like he is having a flare. Lower abdominal pain and rash across his legs x 2 weeks. Appt with [facility] but could not wait. Alert and oriented.

## 2016-05-20 NOTE — ED Notes (Signed)
Patient made aware of urine sample. Urinal placed at bedside and patient encouraged to void when able. 

## 2016-05-20 NOTE — Discharge Instructions (Signed)
KEEP YOUR SCHEDULED APPOINTMENT WITH YOUR RHEUMATOLOGIST. RETURN TO THE EMERGENCY DEPARTMENT IF YOU HAVE HIGH FEVER, SEVERE PAIN, UNCONTROLLED VOMITING.

## 2016-05-20 NOTE — ED Notes (Signed)
Pain medication given in Triage. Patient advised about side effects of medications and  to avoid driving for a minimum of 4 hours.  

## 2016-06-07 ENCOUNTER — Emergency Department (HOSPITAL_COMMUNITY)
Admission: EM | Admit: 2016-06-07 | Discharge: 2016-06-07 | Disposition: A | Discharge status: Home / Self Care | Payer: BLUE CROSS/BLUE SHIELD | Attending: Emergency Medicine | Admitting: Emergency Medicine

## 2016-06-07 ENCOUNTER — Encounter (HOSPITAL_COMMUNITY): Payer: Self-pay

## 2016-06-07 DIAGNOSIS — R0602 Shortness of breath: Secondary | ICD-10-CM

## 2016-06-07 DIAGNOSIS — R1084 Generalized abdominal pain: Secondary | ICD-10-CM | POA: Diagnosis not present

## 2016-06-07 DIAGNOSIS — F909 Attention-deficit hyperactivity disorder, unspecified type: Secondary | ICD-10-CM | POA: Diagnosis not present

## 2016-06-07 DIAGNOSIS — R1030 Lower abdominal pain, unspecified: Secondary | ICD-10-CM

## 2016-06-07 DIAGNOSIS — Z79899 Other long term (current) drug therapy: Secondary | ICD-10-CM | POA: Diagnosis not present

## 2016-06-07 LAB — COMPREHENSIVE METABOLIC PANEL
ALT: 13 U/L — AB (ref 17–63)
AST: 19 U/L (ref 15–41)
Albumin: 4.1 g/dL (ref 3.5–5.0)
Alkaline Phosphatase: 63 U/L (ref 38–126)
Anion gap: 7 (ref 5–15)
BILIRUBIN TOTAL: 0.6 mg/dL (ref 0.3–1.2)
BUN: 13 mg/dL (ref 6–20)
CHLORIDE: 106 mmol/L (ref 101–111)
CO2: 25 mmol/L (ref 22–32)
CREATININE: 1.02 mg/dL (ref 0.61–1.24)
Calcium: 8.7 mg/dL — ABNORMAL LOW (ref 8.9–10.3)
Glucose, Bld: 89 mg/dL (ref 65–99)
POTASSIUM: 3.7 mmol/L (ref 3.5–5.1)
Sodium: 138 mmol/L (ref 135–145)
TOTAL PROTEIN: 6.8 g/dL (ref 6.5–8.1)

## 2016-06-07 LAB — CBC
HCT: 39.4 % (ref 39.0–52.0)
Hemoglobin: 14.4 g/dL (ref 13.0–17.0)
MCH: 31.7 pg (ref 26.0–34.0)
MCHC: 36.5 g/dL — ABNORMAL HIGH (ref 30.0–36.0)
MCV: 86.8 fL (ref 78.0–100.0)
PLATELETS: 156 10*3/uL (ref 150–400)
RBC: 4.54 MIL/uL (ref 4.22–5.81)
RDW: 12.6 % (ref 11.5–15.5)
WBC: 7.2 10*3/uL (ref 4.0–10.5)

## 2016-06-07 LAB — URINALYSIS, ROUTINE W REFLEX MICROSCOPIC
Bilirubin Urine: NEGATIVE
GLUCOSE, UA: NEGATIVE mg/dL
Hgb urine dipstick: NEGATIVE
Ketones, ur: NEGATIVE mg/dL
LEUKOCYTES UA: NEGATIVE
NITRITE: NEGATIVE
PROTEIN: NEGATIVE mg/dL
Specific Gravity, Urine: 1.009 (ref 1.005–1.030)
pH: 6 (ref 5.0–8.0)

## 2016-06-07 LAB — LIPASE, BLOOD: LIPASE: 37 U/L (ref 11–51)

## 2016-06-07 MED ORDER — HYDROMORPHONE HCL 2 MG/ML IJ SOLN
0.5000 mg | Freq: Once | INTRAMUSCULAR | Status: AC
  Administered 2016-06-07: 0.5 mg via INTRAVENOUS

## 2016-06-07 MED ORDER — ONDANSETRON HCL 4 MG/2ML IJ SOLN
4.0000 mg | Freq: Once | INTRAMUSCULAR | Status: AC | PRN
  Administered 2016-06-07: 4 mg via INTRAVENOUS
  Filled 2016-06-07: qty 2

## 2016-06-07 MED ORDER — HYDROMORPHONE HCL 2 MG/ML IJ SOLN
0.5000 mg | Freq: Once | INTRAMUSCULAR | Status: AC
  Administered 2016-06-07: 0.5 mg via INTRAVENOUS
  Filled 2016-06-07: qty 1

## 2016-06-07 MED ORDER — HYDROMORPHONE HCL 2 MG/ML IJ SOLN
INTRAMUSCULAR | Status: AC
  Administered 2016-06-07: 0.5 mg via INTRAVENOUS
  Filled 2016-06-07: qty 1

## 2016-06-07 MED ORDER — DIPHENHYDRAMINE HCL 50 MG/ML IJ SOLN
25.0000 mg | Freq: Once | INTRAMUSCULAR | Status: AC
  Administered 2016-06-07: 25 mg via INTRAVENOUS
  Filled 2016-06-07: qty 1

## 2016-06-07 MED ORDER — SODIUM CHLORIDE 0.9 % IV SOLN
Freq: Once | INTRAVENOUS | Status: AC
  Administered 2016-06-07: 09:00:00 via INTRAVENOUS

## 2016-06-07 MED ORDER — HYDROMORPHONE HCL 2 MG/ML IJ SOLN
1.0000 mg | Freq: Once | INTRAMUSCULAR | Status: AC
  Administered 2016-06-07: 1 mg via INTRAVENOUS
  Filled 2016-06-07: qty 1

## 2016-06-07 MED ORDER — METHYLPREDNISOLONE SODIUM SUCC 125 MG IJ SOLR
125.0000 mg | Freq: Once | INTRAMUSCULAR | Status: AC
  Administered 2016-06-07: 125 mg via INTRAVENOUS
  Filled 2016-06-07: qty 2

## 2016-06-07 MED ORDER — SODIUM CHLORIDE 0.9 % IV BOLUS (SEPSIS)
1000.0000 mL | Freq: Once | INTRAVENOUS | Status: AC
  Administered 2016-06-07: 1000 mL via INTRAVENOUS

## 2016-06-07 MED ORDER — HYDROCODONE-ACETAMINOPHEN 5-325 MG PO TABS: 1.0000 | ORAL_TABLET | ORAL | 0 refills | Status: DC | PRN

## 2016-06-07 MED ORDER — PREDNISONE 10 MG (21) PO TBPK: ORAL_TABLET | ORAL | 0 refills | Status: DC

## 2016-06-07 NOTE — ED Notes (Signed)
Pt. Made aware for the need of urine specimen. 

## 2016-06-07 NOTE — ED Provider Notes (Signed)
[facility] DEPT Provider Note   CSN: [id number] Arrival date & time: 06/07/16  0518     History   Chief Complaint Chief Complaint  Patient presents with  . Abdominal Pain  . Oral Swelling    HPI Gregory Conway is a 21 y.o. male.  Patient with history of Henoch Schonlein Purpura followed at [facility], causing flares of abdominal pain, definitely diagnosed in 2016 after he developed a rash while admitted at [facility] and HSP was confirmed with biopsy.  -- patient presents with complaint of abdominal pain and sensation of throat swelling. Patient states that he was taken off of chronic prednisone one week ago in anticipation of upcoming MRI angiogram scheduled in May. Symptoms worsened 2 days ago. Patient has had generalized abdominal pain similar the previous flares. No associated fevers, vomiting, diarrhea, urinary symptoms. No chest pain or shortness of breath. Patient has been using home medications without much improvement. Patient states that he awoke from sleep this morning feeling like he was having difficulty breathing because his throat was swelling. He did not have associated vomiting, diarrhea, lightheadedness/syncope, lip swelling, tongue swelling. Patient has no previous history of anaphylaxis. No skin rash or hives. Patient took an antihistamine that he uses for seasonal allergies with some improvement. The symptoms are now improved. No sore throat. Shortness of breath is what prompted emergency visit this morning.  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 He would prefer to be on steroids currently because they provide control of symptoms and he has already missed a lot of school. He states that he can call his gastroenterologist next week to discuss steroids regimen and how to taper off.       Past Medical History:  Diagnosis Date  . Acne   . ADHD   . GERD (gastroesophageal reflux disease)   . HSP (Henoch Schonlein purpura) (HCC)      There are no active problems to display for this patient.   History reviewed. No pertinent surgical history.     Home Medications    Prior to Admission medications   Medication Sig Start Date End Date Taking? Authorizing Provider  acetaminophen (TYLENOL) 325 MG tablet Take 650 mg by mouth every 6 (six) hours as needed for mild pain.   Yes Historical Provider, MD  azaTHIOprine (IMURAN) 50 MG tablet Take 50 mg by mouth daily.  05/15/16  Yes Historical Provider, MD  dexmethylphenidate (FOCALIN XR) 10 MG 24 hr capsule Take 10 mg by mouth daily.  04/03/16  Yes Historical Provider, MD  HYDROcodone-acetaminophen (NORCO/VICODIN) 5-325 MG tablet Take 1-2 tablets by mouth every 4 (four) hours as needed. 05/20/16  Yes Elpidio Anis, PA-C  Multiple Vitamins-Minerals (MULTIVITAMIN ADULT) TABS Take 1 tablet by mouth every evening.   Yes Historical Provider, MD  ondansetron (ZOFRAN ODT) 4 MG disintegrating tablet Take 1 tablet (4 mg total) by mouth every 8 (eight) hours as needed for nausea or vomiting. 05/20/16  Yes Shari Upstill, PA-C  ondansetron (ZOFRAN) 4 MG tablet Take 1 tablet (4 mg total) by mouth every 6 (six) hours. Patient not taking: Reported on 06/07/2016 05/11/16   Fayrene Helper, PA-C  traMADol (ULTRAM) 50 MG tablet Take 1 tablet (50 mg total) by mouth every 6 (six) hours as needed. Patient not taking: Reported on 06/07/2016 05/11/16   Fayrene Helper, PA-C    Family History History reviewed. No pertinent family history.  Social History Social History  Substance Use Topics  . Smoking status: Never Smoker  .  Smokeless tobacco: Never Used  . Alcohol use Yes     Comment: rarely     Allergies   Morphine and related and Penicillins   Review of Systems Review of Systems  Constitutional: Negative for fever.  HENT: Negative for facial swelling, rhinorrhea, sore throat and trouble swallowing.   Eyes: Negative for redness.  Respiratory: Positive for shortness of breath. Negative for cough,  wheezing and stridor.   Cardiovascular: Negative for chest pain.  Gastrointestinal: Positive for abdominal pain and nausea. Negative for diarrhea and vomiting.  Genitourinary: Negative for dysuria.  Musculoskeletal: Negative for myalgias.  Skin: Negative for rash.  Neurological: Negative for light-headedness and headaches.  Psychiatric/Behavioral: Negative for confusion.     Physical Exam Updated Vital Signs BP 121/79 (BP Location: Left Arm)   Pulse 86   Temp 97.9 F (36.6 C) (Oral)   Resp 16   SpO2 99%   Physical Exam  Constitutional: He appears well-developed and well-nourished.  HENT:  Head: Normocephalic and atraumatic.  Right Ear: Tympanic membrane, external ear and ear canal normal.  Left Ear: Tympanic membrane, external ear and ear canal normal.  Nose: Nose normal. No mucosal edema or rhinorrhea.  Mouth/Throat: No oropharyngeal exudate. Tonsils are 2+ on the right. Tonsils are 2+ on the left.  No angioedema noted.  Eyes: Conjunctivae are normal. Right eye exhibits no discharge. Left eye exhibits no discharge.  Neck: Normal range of motion. Neck supple.  Cardiovascular: Normal rate, regular rhythm and normal heart sounds.   Pulmonary/Chest: Effort normal and breath sounds normal. No accessory muscle usage. No respiratory distress. He has no wheezes. He has no rales.  No stridor noted.  Abdominal: Soft. There is tenderness (Generalized abdominal pain, mild to moderate). There is no rebound and no guarding.  Neurological: He is alert. Coordination normal.  Skin: Skin is warm and dry.  Psychiatric: He has a normal mood and affect.  Nursing note and vitals reviewed.    ED Treatments / Results  Labs (all labs ordered are listed, but only abnormal results are displayed) Labs Reviewed  LIPASE, BLOOD  COMPREHENSIVE METABOLIC PANEL  CBC  URINALYSIS, ROUTINE W REFLEX MICROSCOPIC    Procedures Procedures (including critical care time)  Medications Ordered in  ED Medications  ondansetron (ZOFRAN) injection 4 mg (not administered)  sodium chloride 0.9 % bolus 1,000 mL (not administered)  methylPREDNISolone sodium succinate (SOLU-MEDROL) 125 mg/2 mL injection 125 mg (not administered)  diphenhydrAMINE (BENADRYL) injection 25 mg (not administered)  HYDROmorphone (DILAUDID) injection 0.5 mg (not administered)     Initial Impression / Assessment and Plan / ED Course  I have reviewed the triage vital signs and the nursing notes.  Pertinent labs & imaging results that were available during my care of the patient were reviewed by me and considered in my medical decision making (see chart for details).     Patient seen and examined. Work-up initiated. Medications ordered. Reviewed old records from [facility].  Vital signs reviewed and are as follows: BP 121/79 (BP Location: Left Arm)   Pulse 86   Temp 97.9 F (36.6 C) (Oral)   Resp 16   SpO2 99%   IV Solu-Medrol, Benadryl ordered. I have low suspicion for anaphylaxis at this point. Patient is in no respiratory distress and his symptoms are improving.  Will attempt control of abdominal pain with Zofran and IV Dilaudid.  Handoff to Leaphart PA-C at shift change. Plan: labs, symptoms control. Home with pain medication and steroids. F/u with GI on  Monday.    Final Clinical Impressions(s) / ED Diagnoses   Final diagnoses:  Generalized abdominal pain  Shortness of breath    New Prescriptions Current Discharge Medication List       Renne Crigler, PA-C 06/07/16 0700    Benjiman Core, MD 06/07/16 [phone]

## 2016-06-07 NOTE — Discharge Instructions (Signed)
Take the prednisone taper pack as prescribed. May use the hydrocodone for pain. Use your Zofran at home for nausea. Follow up with her GI doctor on Monday. Return to the ED for any worsening symptoms.

## 2016-06-07 NOTE — ED Triage Notes (Signed)
Pt reports awakens w/ abd pain, n/v and the sensation of throat swelling. No swelling observed, airway is patent, SPO2 98%. Pt A+OX4, speaking in complete sentences. Pt reports that he has not been on his Rx'd regimen for throat swelling hx.

## 2016-06-07 NOTE — ED Notes (Signed)
PA at bedside.

## 2016-06-13 ENCOUNTER — Encounter (HOSPITAL_COMMUNITY): Payer: Self-pay | Admitting: *Deleted

## 2016-06-13 ENCOUNTER — Emergency Department (HOSPITAL_COMMUNITY)
Admission: EM | Admit: 2016-06-13 | Discharge: 2016-06-14 | Disposition: A | Discharge status: Home / Self Care | Payer: BLUE CROSS/BLUE SHIELD | Attending: Emergency Medicine | Admitting: Emergency Medicine

## 2016-06-13 DIAGNOSIS — R1084 Generalized abdominal pain: Secondary | ICD-10-CM | POA: Diagnosis not present

## 2016-06-13 DIAGNOSIS — G8929 Other chronic pain: Secondary | ICD-10-CM | POA: Diagnosis not present

## 2016-06-13 DIAGNOSIS — F909 Attention-deficit hyperactivity disorder, unspecified type: Secondary | ICD-10-CM | POA: Diagnosis not present

## 2016-06-13 DIAGNOSIS — R109 Unspecified abdominal pain: Secondary | ICD-10-CM | POA: Diagnosis present

## 2016-06-13 LAB — COMPREHENSIVE METABOLIC PANEL
ALK PHOS: 66 U/L (ref 38–126)
ALT: 17 U/L (ref 17–63)
AST: 21 U/L (ref 15–41)
Albumin: 4.6 g/dL (ref 3.5–5.0)
Anion gap: 9 (ref 5–15)
BUN: 19 mg/dL (ref 6–20)
CALCIUM: 8.9 mg/dL (ref 8.9–10.3)
CHLORIDE: 105 mmol/L (ref 101–111)
CO2: 25 mmol/L (ref 22–32)
CREATININE: 1.28 mg/dL — AB (ref 0.61–1.24)
GFR calc Af Amer: >60 mL/min (ref >60)
Glucose, Bld: 105 mg/dL — ABNORMAL HIGH (ref 65–99)
Potassium: 3.8 mmol/L (ref 3.5–5.1)
SODIUM: 139 mmol/L (ref 135–145)
Total Bilirubin: 1.4 mg/dL — ABNORMAL HIGH (ref 0.3–1.2)
Total Protein: 7.3 g/dL (ref 6.5–8.1)

## 2016-06-13 LAB — CBC
HCT: 40.6 % (ref 39.0–52.0)
Hemoglobin: 14.7 g/dL (ref 13.0–17.0)
MCH: 31.6 pg (ref 26.0–34.0)
MCHC: 36.2 g/dL — ABNORMAL HIGH (ref 30.0–36.0)
MCV: 87.3 fL (ref 78.0–100.0)
PLATELETS: 193 10*3/uL (ref 150–400)
RBC: 4.65 MIL/uL (ref 4.22–5.81)
RDW: 12.8 % (ref 11.5–15.5)
WBC: 10.9 10*3/uL — ABNORMAL HIGH (ref 4.0–10.5)

## 2016-06-13 LAB — LIPASE, BLOOD: LIPASE: 36 U/L (ref 11–51)

## 2016-06-13 MED ORDER — SODIUM CHLORIDE 0.9 % IV BOLUS (SEPSIS)
1000.0000 mL | Freq: Once | INTRAVENOUS | Status: AC
  Administered 2016-06-13: 1000 mL via INTRAVENOUS

## 2016-06-13 MED ORDER — METOCLOPRAMIDE HCL 5 MG/ML IJ SOLN
10.0000 mg | Freq: Once | INTRAMUSCULAR | Status: AC
  Administered 2016-06-13: 10 mg via INTRAVENOUS
  Filled 2016-06-13: qty 2

## 2016-06-13 MED ORDER — DIPHENHYDRAMINE HCL 50 MG/ML IJ SOLN
12.5000 mg | Freq: Once | INTRAMUSCULAR | Status: AC
  Administered 2016-06-13: 12.5 mg via INTRAVENOUS
  Filled 2016-06-13: qty 1

## 2016-06-13 MED ORDER — HYDROMORPHONE HCL 2 MG/ML IJ SOLN
1.0000 mg | Freq: Once | INTRAMUSCULAR | Status: AC
  Administered 2016-06-13: 1 mg via INTRAVENOUS
  Filled 2016-06-13: qty 1

## 2016-06-13 MED ORDER — ONDANSETRON HCL 4 MG/2ML IJ SOLN
4.0000 mg | Freq: Once | INTRAMUSCULAR | Status: AC
  Administered 2016-06-13: 4 mg via INTRAVENOUS
  Filled 2016-06-13: qty 2

## 2016-06-13 NOTE — ED Notes (Signed)
Girlfriend in bed with patient

## 2016-06-13 NOTE — ED Provider Notes (Signed)
[facility]ER[facility] DEPT Provider Note   CSN: [id number] Arrival date & time: 06/13/16  2125 By signing my name below, I, Levon Hedger, attest that this documentation has been prepared under the direction and in the presence of non-physician practitioner, Melvenia Beam A Savanah Bayles PA-C. Electronically Signed: Levon Hedger, Scribe. 06/13/2016. 10:56 PM.   History   Chief Complaint Chief Complaint  Patient presents with  . Abdominal Pain   HPI Gregory Conway is a 21 y.o. male with a history of Henoch Schonlein Purpura who presents to the Emergency Department complaining of severe, gradually worsening abdominal pain onset last night. He reports associate nausea, vomiting, and a throat tightening sensation. Pt was seen in the ED one week ago for the same and was given 125 solumedrol, Zofran, Benadryl with initial relief of symptoms. Per pt, his symptoms have gradually worsened since being treated in the ED. Pt has taken 25 mg of Benadryl tonight at 6:30 pm with no significant relief of symptoms. Per pt, he was taken off his chronic prednisone in anticipation of his MRI angiogram scheduled for 06/27/16. Pt is currently followed at [facility] for [facility]. No abdominal SHx. He denies any fever, SOB, or any other acute symptoms.   The history is provided by the patient. No language interpreter was used.   Past Medical History:  Diagnosis Date  . Acne   . ADHD   . GERD (gastroesophageal reflux disease)   . HSP (Henoch Schonlein purpura) (HCC)    There are no active problems to display for this patient.  History reviewed. No pertinent surgical history.   Home Medications    Prior to Admission medications   Medication Sig Start Date End Date Taking? Authorizing Provider  acetaminophen (TYLENOL) 325 MG tablet Take 650 mg by mouth every 6 (six) hours as needed for mild pain.   Yes Historical Provider, MD  azaTHIOprine (IMURAN) 50 MG tablet Take 50 mg by mouth daily.  05/15/16  Yes Historical Provider, MD    dexmethylphenidate (FOCALIN XR) 10 MG 24 hr capsule Take 10 mg by mouth daily.  04/03/16  Yes Historical Provider, MD  diphenhydrAMINE (BENADRYL) 12.5 MG/5ML elixir Take 25 mg by mouth 4 (four) times daily as needed for itching or allergies.   Yes Historical Provider, MD  HYDROcodone-acetaminophen (NORCO/VICODIN) 5-325 MG tablet Take 1-2 tablets by mouth every 4 (four) hours as needed. 06/07/16  Yes Rise Mu, PA-C  Multiple Vitamins-Minerals (MULTIVITAMIN ADULT) TABS Take 1 tablet by mouth every evening.   Yes Historical Provider, MD  predniSONE (STERAPRED UNI-PAK 21 TAB) 10 MG (21) TBPK tablet Take as prescribe 06/07/16  Yes Iantha Fallen T Leaphart, PA-C  ondansetron (ZOFRAN ODT) 4 MG disintegrating tablet Take 1 tablet (4 mg total) by mouth every 8 (eight) hours as needed for nausea or vomiting. Patient not taking: Reported on 06/13/2016 05/20/16   Elpidio Anis, PA-C   Family History No family history on file.  Social History Social History  Substance Use Topics  . Smoking status: Never Smoker  . Smokeless tobacco: Never Used  . Alcohol use Yes     Comment: rarely    Allergies   Morphine and related and Penicillins   Review of Systems Review of Systems  Constitutional: Negative for fever.  HENT:       +throat tightness  Respiratory: Negative for shortness of breath.   Cardiovascular: Negative for chest pain.  Gastrointestinal: Positive for abdominal pain, nausea and vomiting.  Skin: Negative for wound.  Allergic/Immunologic: Negative for immunocompromised state.  Hematological: Does not bruise/bleed easily.  Psychiatric/Behavioral: Negative for confusion.   Physical Exam Updated Vital Signs BP 131/84 (BP Location: Left Arm)   Pulse 90   Temp 97.9 F (36.6 C) (Oral)   Resp 12   Ht 6' (1.829 m)   Wt 198 lb 8 oz (90 kg)   SpO2 98%   BMI 26.92 kg/m   Physical Exam  Constitutional: He is oriented to person, place, and time. He appears well-developed and  well-nourished. No distress.  HENT:  Head: Normocephalic and atraumatic.  Eyes: Conjunctivae are normal.  Cardiovascular: Normal rate and regular rhythm.   Pulmonary/Chest: Effort normal and breath sounds normal.  Abdominal: He exhibits no distension. There is tenderness.  Abdomen is generally tender, hyperactive bowel sounds, non distended.   Neurological: He is alert and oriented to person, place, and time.  Skin: Skin is warm and dry.  Psychiatric: He has a normal mood and affect.  Nursing note and vitals reviewed.   ED Treatments / Results  DIAGNOSTIC STUDIES:  Oxygen Saturation is 98% on RA, normal by my interpretation.    COORDINATION OF CARE:  10:31 PM Will order Dilaudid and Zofran. Discussed treatment plan with pt at bedside and pt agreed to plan.   Labs (all labs ordered are listed, but only abnormal results are displayed) Labs Reviewed  LIPASE, BLOOD  COMPREHENSIVE METABOLIC PANEL  CBC  URINALYSIS, ROUTINE W REFLEX MICROSCOPIC    EKG  EKG Interpretation None       Radiology No results found.  Procedures Procedures (including critical care time)  Medications Ordered in ED Medications - No data to display   Initial Impression / Assessment and Plan / ED Course  I have reviewed the triage vital signs and the nursing notes.  Pertinent labs & imaging results that were available during my care of the patient were reviewed by me and considered in my medical decision making (see chart for details).     Patient here for recurrent abdominal pain, currently being evaluated at [facility], he states, for possible abdominal angioedema. He states that steroids are the only thing that help with symptoms, however, his doctor at [facility] did not want him on steroids pending MRA abdomen for best study possible to rule it in or out.  Pain is managed in ED. VSS. No evidence of infection. MRA scheduled for Monday (in 2 days) as outpatient. Results to be followed by his  physician at [facility]. Steroids avoided. He can be discharged home and is comfortable with disposition.  Final Clinical Impressions(s) / ED Diagnoses   Final diagnoses:  None   1. Chronic abdomina pain New Prescriptions New Prescriptions   No medications on file   I personally performed the services described in this documentation, which was scribed in my presence. The recorded information has been reviewed and is accurate.      Elpidio Anis, PA-C 06/23/16 0805    Charlynne Pander, MD 06/24/16 1000

## 2016-06-13 NOTE — ED Notes (Signed)
Patient resting quietly with eyes closed in no distress

## 2016-06-13 NOTE — ED Triage Notes (Addendum)
Pt complains of lower abdominal pain and emesis since last night, which has gotten progressively worse. Pt has hx of HSP and intestinal angioedema. Pt is to have angiogram but cannot wait to get in due to pain. Pt denies diarrhea.   Pt states he feels like his throat is closing up. Pt had similar episode last week and was given steroids.

## 2016-06-13 NOTE — ED Notes (Signed)
Requested patient to urinate. 

## 2016-06-14 LAB — URINALYSIS, ROUTINE W REFLEX MICROSCOPIC
BILIRUBIN URINE: NEGATIVE
Glucose, UA: NEGATIVE mg/dL
HGB URINE DIPSTICK: NEGATIVE
KETONES UR: NEGATIVE mg/dL
Leukocytes, UA: NEGATIVE
Nitrite: NEGATIVE
PROTEIN: NEGATIVE mg/dL
Specific Gravity, Urine: 1.03 (ref 1.005–1.030)
pH: 5 (ref 5.0–8.0)

## 2016-06-14 MED ORDER — HYDROCODONE-ACETAMINOPHEN 5-325 MG PO TABS: 1.0000 | ORAL_TABLET | ORAL | 0 refills | Status: DC | PRN

## 2016-06-14 MED ORDER — METOCLOPRAMIDE HCL 10 MG PO TABS: 10.0000 mg | ORAL_TABLET | Freq: Four times a day (QID) | ORAL | 0 refills | Status: DC

## 2016-06-14 MED ORDER — HYDROMORPHONE HCL 2 MG/ML IJ SOLN
1.0000 mg | Freq: Once | INTRAMUSCULAR | Status: AC
  Administered 2016-06-14: 1 mg via INTRAVENOUS
  Filled 2016-06-14: qty 1

## 2016-06-14 NOTE — Discharge Instructions (Signed)
Go to [facility] Radiology admitting for scheduled MRA Abdomen on Monday morning at 8:00 am. Take medications as prescribed. Return to the emergency department if you have severe abdominal pain, high fever, or new concern. You should let your GI physician at [facility] (Dr. Chilton Si) know you are getting your MRA done so that he can cancel your study scheduled for May 4th, and also to follow up on results.

## 2016-06-24 ENCOUNTER — Encounter (HOSPITAL_COMMUNITY): Payer: Self-pay | Admitting: Emergency Medicine

## 2016-06-24 ENCOUNTER — Emergency Department (HOSPITAL_COMMUNITY)
Admission: EM | Admit: 2016-06-24 | Discharge: 2016-06-24 | Disposition: A | Discharge status: Home / Self Care | Payer: BLUE CROSS/BLUE SHIELD | Attending: Emergency Medicine | Admitting: Emergency Medicine

## 2016-06-24 ENCOUNTER — Emergency Department (HOSPITAL_COMMUNITY): Payer: BLUE CROSS/BLUE SHIELD

## 2016-06-24 DIAGNOSIS — R1030 Lower abdominal pain, unspecified: Secondary | ICD-10-CM | POA: Diagnosis not present

## 2016-06-24 DIAGNOSIS — Z79899 Other long term (current) drug therapy: Secondary | ICD-10-CM | POA: Diagnosis not present

## 2016-06-24 DIAGNOSIS — F909 Attention-deficit hyperactivity disorder, unspecified type: Secondary | ICD-10-CM | POA: Diagnosis not present

## 2016-06-24 DIAGNOSIS — K5900 Constipation, unspecified: Secondary | ICD-10-CM | POA: Diagnosis not present

## 2016-06-24 DIAGNOSIS — R109 Unspecified abdominal pain: Secondary | ICD-10-CM

## 2016-06-24 LAB — COMPREHENSIVE METABOLIC PANEL
ALK PHOS: 68 U/L (ref 38–126)
ALT: 23 U/L (ref 17–63)
AST: 21 U/L (ref 15–41)
Albumin: 4.1 g/dL (ref 3.5–5.0)
Anion gap: 8 (ref 5–15)
BILIRUBIN TOTAL: 1.1 mg/dL (ref 0.3–1.2)
BUN: 13 mg/dL (ref 6–20)
CALCIUM: 8.9 mg/dL (ref 8.9–10.3)
CHLORIDE: 105 mmol/L (ref 101–111)
CO2: 25 mmol/L (ref 22–32)
CREATININE: 1.02 mg/dL (ref 0.61–1.24)
Glucose, Bld: 91 mg/dL (ref 65–99)
Potassium: 3.8 mmol/L (ref 3.5–5.1)
Sodium: 138 mmol/L (ref 135–145)
TOTAL PROTEIN: 6.7 g/dL (ref 6.5–8.1)

## 2016-06-24 LAB — CBC
HEMATOCRIT: 41.7 % (ref 39.0–52.0)
Hemoglobin: 15 g/dL (ref 13.0–17.0)
MCH: 32.3 pg (ref 26.0–34.0)
MCHC: 36 g/dL (ref 30.0–36.0)
MCV: 89.7 fL (ref 78.0–100.0)
PLATELETS: 165 10*3/uL (ref 150–400)
RBC: 4.65 MIL/uL (ref 4.22–5.81)
RDW: 12.7 % (ref 11.5–15.5)
WBC: 6.5 10*3/uL (ref 4.0–10.5)

## 2016-06-24 LAB — URINALYSIS, ROUTINE W REFLEX MICROSCOPIC
Bilirubin Urine: NEGATIVE
Glucose, UA: NEGATIVE mg/dL
HGB URINE DIPSTICK: NEGATIVE
KETONES UR: NEGATIVE mg/dL
Leukocytes, UA: NEGATIVE
NITRITE: NEGATIVE
PROTEIN: NEGATIVE mg/dL
Specific Gravity, Urine: 1.005 (ref 1.005–1.030)
pH: 7 (ref 5.0–8.0)

## 2016-06-24 MED ORDER — IOPAMIDOL (ISOVUE-300) INJECTION 61%
INTRAVENOUS | Status: AC
Start: 2016-06-24 — End: 2016-06-24
  Administered 2016-06-24: 100 mL
  Filled 2016-06-24: qty 100

## 2016-06-24 MED ORDER — ONDANSETRON HCL 4 MG/2ML IJ SOLN
4.0000 mg | Freq: Once | INTRAMUSCULAR | Status: AC
  Administered 2016-06-24: 4 mg via INTRAVENOUS
  Filled 2016-06-24: qty 2

## 2016-06-24 MED ORDER — SODIUM CHLORIDE 0.9 % IV BOLUS (SEPSIS)
1000.0000 mL | Freq: Once | INTRAVENOUS | Status: AC
  Administered 2016-06-24: 1000 mL via INTRAVENOUS

## 2016-06-24 MED ORDER — HYDROMORPHONE HCL 1 MG/ML IJ SOLN
1.0000 mg | Freq: Once | INTRAMUSCULAR | Status: AC
  Administered 2016-06-24: 1 mg via INTRAVENOUS
  Filled 2016-06-24: qty 1

## 2016-06-24 MED ORDER — METOCLOPRAMIDE HCL 10 MG PO TABS: 10.0000 mg | ORAL_TABLET | Freq: Three times a day (TID) | ORAL | 0 refills | Status: DC | PRN

## 2016-06-24 MED ORDER — HYDROMORPHONE HCL 1 MG/ML IJ SOLN
1.0000 mg | Freq: Once | INTRAMUSCULAR | Status: AC
Start: 2016-06-24 — End: 2016-06-24
  Administered 2016-06-24: 1 mg via INTRAVENOUS
  Filled 2016-06-24: qty 1

## 2016-06-24 NOTE — ED Notes (Signed)
Pt tx'd to CT 

## 2016-06-24 NOTE — ED Triage Notes (Signed)
abd with N/V since yesterday. This is an ongoing problem. He has a hx of HSP.  Pt states it is treated with steroids but he had to stop taking them due to an upcoming test so the symptoms are worse.

## 2016-06-24 NOTE — ED Notes (Signed)
MADE FIRST REQUEST FOR A URINE SAMPLE, HE WAS UNABLE TO PROVIDE ONE AT THIS TIME

## 2016-06-24 NOTE — ED Provider Notes (Signed)
[facility]-EMERGENCY DEPT Provider Note   CSN: [id number] Arrival date & time: 06/24/16  4098     History   Chief Complaint Chief Complaint  Patient presents with  . Abdominal Pain    HPI Gregory Conway is a 21 y.o. male.  HPI Patient has a history of HSP a history of recurrent abdominal pain since then.  His been dealing with ongoing abdominal pain over the past 4-5 months and has had a significant outpatient workup by his GI team at [facility].  He presents now with ongoing lower abdominal discomfort.  He states this worsened over the past several days.  He reports nausea and vomiting since yesterday.  He reports improvement usually with steroids but he is trying to avoid steroids at this time as he is scheduled for MRI of his abdomen and pelvis in 3 days to evaluate for the possibility of intestinal angioedema under the direction of his [facility] GI team.  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  He reports some decreased bowel movements.  No dysuria or urinary frequency.   Past Medical History:  Diagnosis Date  . Acne   . ADHD   . GERD (gastroesophageal reflux disease)   . HSP (Henoch Schonlein purpura) (HCC)     There are no active problems to display for this patient.   History reviewed. No pertinent surgical history.     Home Medications    Prior to Admission medications   Medication Sig Start Date End Date Taking? Authorizing Provider  acetaminophen (TYLENOL) 325 MG tablet Take 650 mg by mouth every 6 (six) hours as needed for mild pain.   Yes Historical Provider, MD  azaTHIOprine (IMURAN) 50 MG tablet Take 50 mg by mouth daily.  05/15/16  Yes Historical Provider, MD  dexmethylphenidate (FOCALIN XR) 10 MG 24 hr capsule Take 10 mg by mouth daily.  04/03/16  Yes Historical Provider, MD  diphenhydrAMINE (BENADRYL) 12.5 MG/5ML elixir Take 25 mg by mouth 4 (four) times daily as needed for itching or allergies.   Yes Historical Provider, MD    HYDROcodone-acetaminophen (NORCO/VICODIN) 5-325 MG tablet Take 1-2 tablets by mouth every 4 (four) hours as needed. 06/14/16  Yes Elpidio Anis, PA-C  Multiple Vitamins-Minerals (MULTIVITAMIN ADULT) TABS Take 1 tablet by mouth every evening.   Yes Historical Provider, MD  metoCLOPramide (REGLAN) 10 MG tablet Take 1 tablet (10 mg total) by mouth every 8 (eight) hours as needed for nausea. 06/24/16   Azalia Bilis, MD  predniSONE (STERAPRED UNI-PAK 21 TAB) 10 MG (21) TBPK tablet Take as prescribe Patient not taking: Reported on 06/24/2016 06/07/16   Rise Mu, PA-C    Family History No family history on file.  Social History Social History  Substance Use Topics  . Smoking status: Never Smoker  . Smokeless tobacco: Never Used  . Alcohol use Yes     Comment: rarely     Allergies   Morphine and related and Penicillins   Review of Systems Review of Systems  All other systems reviewed and are negative.    Physical Exam Updated Vital Signs BP 126/74 (BP Location: Right Arm)   Pulse 64   Temp 98.2 F (36.8 C) (Oral)   Resp 14   Ht 6' (1.829 m)   Wt 198 lb (89.8 kg)   SpO2 98%   BMI 26.85 kg/m   Physical Exam  Constitutional: He is oriented to person, place, and time. He appears well-developed  and well-nourished.  HENT:  Head: Normocephalic and atraumatic.  Eyes: EOM are normal.  Neck: Normal range of motion.  Cardiovascular: Normal rate and regular rhythm.   Pulmonary/Chest: Effort normal and breath sounds normal. No respiratory distress.  Abdominal: Soft. He exhibits no distension.  Mild lower abdominal tenderness without guarding or rebound  Musculoskeletal: Normal range of motion.  Neurological: He is alert and oriented to person, place, and time.  Skin: Skin is warm and dry.  Psychiatric: He has a normal mood and affect. Judgment normal.  Nursing note and vitals reviewed.    ED Treatments / Results  Labs (all labs ordered are listed, but only abnormal  results are displayed) Labs Reviewed  URINALYSIS, ROUTINE W REFLEX MICROSCOPIC - Abnormal; Notable for the following:       Result Value   Color, Urine STRAW (*)    All other components within normal limits  CBC  COMPREHENSIVE METABOLIC PANEL    EKG  EKG Interpretation None       Radiology Ct Abdomen Pelvis W Contrast  Result Date: 06/24/2016 CLINICAL DATA:  Pain with nausea and vomiting since yesterday EXAM: CT ABDOMEN AND PELVIS WITH CONTRAST TECHNIQUE: Multidetector CT imaging of the abdomen and pelvis was performed using the standard protocol following bolus administration of intravenous contrast. CONTRAST:  ISOVUE-300 IOPAMIDOL (ISOVUE-300) INJECTION 61% COMPARISON:  05/11/2016 FINDINGS: Lower chest: No acute abnormality. Hepatobiliary: No focal liver abnormality is seen. No gallstones, gallbladder wall thickening, or biliary dilatation. Pancreas: Unremarkable. No pancreatic ductal dilatation or surrounding inflammatory changes. Spleen: Normal in size without focal abnormality. Adrenals/Urinary Tract: Adrenal glands are unremarkable. Kidneys are normal, without renal calculi, focal lesion, or hydronephrosis. Bladder is unremarkable. Stomach/Bowel: The stomach is physiologically distended. There is normal small bowel rotation without inflammation or obstruction. There is stool throughout large bowel from cecum to rectum consistent constipation. Normal appendix Vascular/Lymphatic: No significant vascular findings are present. No enlarged abdominal or pelvic lymph nodes. Reproductive: Prostate is unremarkable. Other: No abdominal wall hernia or abnormality. No abdominopelvic ascites. Musculoskeletal: Nonacute IMPRESSION: Increased colonic stool burden consistent constipation. No bowel obstruction or inflammation. Electronically Signed   By: Tollie Eth M.D.   On: 06/24/2016 15:49    Procedures Procedures (including critical care time)  Medications Ordered in ED Medications    HYDROmorphone (DILAUDID) injection 1 mg (1 mg Intravenous Given 06/24/16 1043)  ondansetron (ZOFRAN) injection 4 mg (4 mg Intravenous Given 06/24/16 1043)  sodium chloride 0.9 % bolus 1,000 mL (0 mLs Intravenous Stopped 06/24/16 1224)  HYDROmorphone (DILAUDID) injection 1 mg (1 mg Intravenous Given 06/24/16 1305)  sodium chloride 0.9 % bolus 1,000 mL (0 mLs Intravenous Stopped 06/24/16 1445)  HYDROmorphone (DILAUDID) injection 1 mg (1 mg Intravenous Given 06/24/16 1452)  iopamidol (ISOVUE-300) 61 % injection (100 mLs  Contrast Given 06/24/16 1536)     Initial Impression / Assessment and Plan / ED Course  I have reviewed the triage vital signs and the nursing notes.  Pertinent labs & imaging results that were available during my care of the patient were reviewed by me and considered in my medical decision making (see chart for details).     Patient feels better this time.  CT scan demonstrates constipation.  Patient will start twice a day MiraLAX.  Primary care and GI follow-up.  No indication for additional workup  Final Clinical Impressions(s) / ED Diagnoses   Final diagnoses:  Acute abdominal pain  Constipation, unspecified constipation type    New Prescriptions New Prescriptions  METOCLOPRAMIDE (REGLAN) 10 MG TABLET    Take 1 tablet (10 mg total) by mouth every 8 (eight) hours as needed for nausea.     Azalia Bilis, MD 06/24/16 [phone]

## 2017-01-01 ENCOUNTER — Emergency Department (HOSPITAL_COMMUNITY)
Admission: EM | Admit: 2017-01-01 | Discharge: 2017-01-02 | Disposition: A | Discharge status: Home / Self Care | Payer: BLUE CROSS/BLUE SHIELD | Attending: Emergency Medicine | Admitting: Emergency Medicine

## 2017-01-01 ENCOUNTER — Encounter (HOSPITAL_COMMUNITY): Payer: Self-pay | Admitting: Emergency Medicine

## 2017-01-01 ENCOUNTER — Other Ambulatory Visit: Payer: Self-pay

## 2017-01-01 DIAGNOSIS — R1030 Lower abdominal pain, unspecified: Secondary | ICD-10-CM | POA: Insufficient documentation

## 2017-01-01 DIAGNOSIS — Z79899 Other long term (current) drug therapy: Secondary | ICD-10-CM | POA: Insufficient documentation

## 2017-01-01 DIAGNOSIS — R109 Unspecified abdominal pain: Secondary | ICD-10-CM

## 2017-01-01 DIAGNOSIS — G8929 Other chronic pain: Secondary | ICD-10-CM

## 2017-01-01 MED ORDER — DEXAMETHASONE SODIUM PHOSPHATE 10 MG/ML IJ SOLN
10.0000 mg | Freq: Once | INTRAMUSCULAR | Status: AC
  Administered 2017-01-01: 10 mg via INTRAVENOUS
  Filled 2017-01-01: qty 1

## 2017-01-01 MED ORDER — SODIUM CHLORIDE 0.9 % IV BOLUS (SEPSIS)
1000.0000 mL | Freq: Once | INTRAVENOUS | Status: AC
  Administered 2017-01-02: 1000 mL via INTRAVENOUS

## 2017-01-01 MED ORDER — ONDANSETRON HCL 4 MG/2ML IJ SOLN
4.0000 mg | Freq: Once | INTRAMUSCULAR | Status: AC
  Administered 2017-01-01: 4 mg via INTRAVENOUS
  Filled 2017-01-01: qty 2

## 2017-01-01 MED ORDER — DICYCLOMINE HCL 10 MG PO CAPS
20.0000 mg | ORAL_CAPSULE | Freq: Once | ORAL | Status: AC
  Administered 2017-01-02: 20 mg via ORAL
  Filled 2017-01-01: qty 2

## 2017-01-01 MED ORDER — HYDROMORPHONE HCL 1 MG/ML IJ SOLN
1.0000 mg | Freq: Once | INTRAMUSCULAR | Status: AC
Start: 2017-01-01 — End: 2017-01-01
  Administered 2017-01-01: 1 mg via INTRAVENOUS
  Filled 2017-01-01: qty 1

## 2017-01-01 MED ORDER — SODIUM CHLORIDE 0.9 % IV BOLUS (SEPSIS)
1000.0000 mL | Freq: Once | INTRAVENOUS | Status: AC
  Administered 2017-01-01: 1000 mL via INTRAVENOUS

## 2017-01-01 NOTE — ED Triage Notes (Signed)
Pt c/o back and abd pain onset x4 days increased pain today. Denies event or injury leading to current Hx of chronic back pain

## 2017-01-01 NOTE — ED Provider Notes (Signed)
[facility] DEPT Provider Note   CSN: [id number] Arrival date & time: 01/01/17  2056     History   Chief Complaint Chief Complaint  Patient presents with  . Back Pain  . Abdominal Pain    HPI Gregory Conway is a 21 y.o. male.  The history is provided by the patient and medical records.  Back Pain   Associated symptoms include abdominal pain.  Abdominal Pain   Associated symptoms include nausea.    21 y.o. M with hx of GERD, ADHD, HSP, ankylosing spondylitis, presenting to the ED with abdominal pain and back pain.  Patient has long standing history of abdominal pain-- followed by GI at [facility]. Was seen there today actually, diagnosed with duodenitis/gastritis and had blood work done which was normal.  States he is being set up for capsule endoscopy and referred to pain management.  He was started on tramadol and steroids.  Reports he has been having nausea and loss of appetite all day.  States pain localized to lower abdomen.  States capsule endoscopy is scheduled for sometime in the next 2 months.  States he just feels very uncomfortable.  Back pain is chronic and unchanged for him.  States he just thinks it is "flaring up".  No new numbness/weakness of arms or legs.  No bowel or bladder incontinence.  No urinary symptoms.  Past Medical History:  Diagnosis Date  . Acne   . ADHD   . GERD (gastroesophageal reflux disease)   . HSP (Henoch Schonlein purpura) (HCC)     There are no active problems to display for this patient.   History reviewed. No pertinent surgical history.     Home Medications    Prior to Admission medications   Medication Sig Start Date End Date Taking? Authorizing Provider  acetaminophen (TYLENOL) 325 MG tablet Take 650 mg by mouth every 6 (six) hours as needed for mild pain.    [provider]  azaTHIOprine (IMURAN) 50 MG tablet Take 50 mg by mouth daily.  05/15/16   [provider]  dexmethylphenidate  (FOCALIN XR) 10 MG 24 hr capsule Take 10 mg by mouth daily.  04/03/16   [provider]  diphenhydrAMINE (BENADRYL) 12.5 MG/5ML elixir Take 25 mg by mouth 4 (four) times daily as needed for itching or allergies.    [provider]  HYDROcodone-acetaminophen (NORCO/VICODIN) 5-325 MG tablet Take 1-2 tablets by mouth every 4 (four) hours as needed. 06/14/16   Charlann Lange, PA-C  metoCLOPramide (REGLAN) 10 MG tablet Take 1 tablet (10 mg total) by mouth every 8 (eight) hours as needed for nausea. 06/24/16   Jola Schmidt, MD  Multiple Vitamins-Minerals (MULTIVITAMIN ADULT) TABS Take 1 tablet by mouth every evening.    [provider]  predniSONE (STERAPRED UNI-PAK 21 TAB) 10 MG (21) TBPK tablet Take as prescribe Patient not taking: Reported on 06/24/2016 06/07/16   Doristine Devoid, PA-C    Family History History reviewed. No pertinent family history.  Social History Social History   Tobacco Use  . Smoking status: Never Smoker  . Smokeless tobacco: Never Used  Substance Use Topics  . Alcohol use: Yes    Comment: rarely  . Drug use: No     Allergies   Morphine and related and Penicillins   Review of Systems Review of Systems  Gastrointestinal: Positive for abdominal pain and nausea.  Musculoskeletal: Positive for back pain.  All other systems reviewed and are negative.    Physical  Exam Updated Vital Signs BP (!) 137/92 (BP Location: Left Arm)   Pulse 84   Temp 97.8 F (36.6 C) (Oral)   Resp 16   Ht '6\' 1"'$  (1.854 m)   Wt 97.5 kg (215 lb)   SpO2 100%   BMI 28.37 kg/m   Physical Exam  Constitutional: He is oriented to person, place, and time. He appears well-developed and well-nourished.  Curled up in fetal position on right side, holding abdomen  HENT:  Head: Normocephalic and atraumatic.  Mouth/Throat: Oropharynx is clear and moist.  Eyes: Conjunctivae and EOM are normal. Pupils are equal, round, and reactive to light.  Neck: Normal range of  motion.  Cardiovascular: Normal rate, regular rhythm and normal heart sounds.  Pulmonary/Chest: Effort normal and breath sounds normal.  Abdominal: Soft. Bowel sounds are normal. There is no tenderness. There is no rebound.  Abdomen soft, nontender, no peritoneal signs  Musculoskeletal: Normal range of motion.  Neurological: He is alert and oriented to person, place, and time.  Skin: Skin is warm and dry.  Psychiatric: He has a normal mood and affect.  Nursing note and vitals reviewed.    ED Treatments / Results  Labs (all labs ordered are listed, but only abnormal results are displayed) Labs Reviewed - No data to display  EKG  EKG Interpretation None       Radiology No results found.  Procedures Procedures (including critical care time)  Medications Ordered in ED Medications  sodium chloride 0.9 % bolus 1,000 mL (0 mLs Intravenous Stopped 01/01/17 2340)  HYDROmorphone (DILAUDID) injection 1 mg (1 mg Intravenous Given 01/01/17 2238)  ondansetron (ZOFRAN) injection 4 mg (4 mg Intravenous Given 01/01/17 2238)  dexamethasone (DECADRON) injection 10 mg (10 mg Intravenous Given 01/01/17 2239)  dicyclomine (BENTYL) capsule 20 mg (20 mg Oral Given 01/02/17 0023)  sodium chloride 0.9 % bolus 1,000 mL (0 mLs Intravenous Stopped 01/02/17 0144)  HYDROmorphone (DILAUDID) injection 1 mg (1 mg Intravenous Given 01/02/17 0104)     Initial Impression / Assessment and Plan / ED Course  I have reviewed the triage vital signs and the nursing notes.  Pertinent labs & imaging results that were available during my care of the patient were reviewed by me and considered in my medical decision making (see chart for details).  21 year old male here with back and abdominal pain.  Has long-standing history of both.  He was seen by his GI doctor earlier today and diagnosed with gastritis/duodenitis.  He was started on tramadol and steroid taper.  He is afebrile and nontoxic.  Abdomen is soft and benign  on my exam.  No peritoneal signs.  On chart review, patient had lab work done today which was overall reassuring including CBC, BMP, ESR, and CRP.  He has had multiple imaging studies including numerous CT scans, MRI of the abdomen, MRA of the abdomen, ultrasounds, x-rays, HIDA scan, swallowing study, EGD, colonoscopy, etc.  without clear cut diagnosis. He is scheduled for capsule endoscopy sometime in the next 2 months and blood has been sent out for genetic testing.  I discussed with patient the limited benefit of repeating his labs or obtaining repeat CT scan at this time as I do not feel it will lead [place] to any conclusive diagnosis today.  Will attempt pain control.  12:40 AM Patient reassessed.  He is curled up in the bed crying.  States he has not had any relief from given medications.  I discussed with him that he has a  long-standing history of chronic abdominal pain and we are unlikely to completely resolve this here today but we are trying to help manage symptoms.  He then retracts his statement and reports "well the Dilaudid helped a little".  Medications re-dosed,  patient stable for discharge.  He has scheduled follow-up with his GI doctor.  Discussed plan with patient, he acknowledged understanding and agreed with plan of care.  Return precautions given for new or worsening symptoms.  Final Clinical Impressions(s) / ED Diagnoses   Final diagnoses:  Chronic abdominal pain    ED Discharge Orders    None       Larene Pickett, PA-C 01/02/17 0448    Carmin Muskrat, MD 01/03/17 [phone]

## 2017-01-02 ENCOUNTER — Other Ambulatory Visit: Payer: Self-pay

## 2017-01-02 MED ORDER — HYDROMORPHONE HCL 1 MG/ML IJ SOLN
1.0000 mg | Freq: Once | INTRAMUSCULAR | Status: AC
  Administered 2017-01-02: 1 mg via INTRAVENOUS
  Filled 2017-01-02: qty 1

## 2017-01-02 NOTE — Discharge Instructions (Signed)
You can continue the steroids and tramadol from your GI doctor. Follow-up with them if ongoing issues. Return here for new concerns.

## 2017-01-02 NOTE — ED Notes (Signed)
Patient waiting to speak with PA

## 2017-01-20 ENCOUNTER — Other Ambulatory Visit: Payer: Self-pay

## 2017-01-20 ENCOUNTER — Emergency Department (HOSPITAL_COMMUNITY): Payer: BLUE CROSS/BLUE SHIELD

## 2017-01-20 ENCOUNTER — Emergency Department (HOSPITAL_COMMUNITY)
Admission: EM | Admit: 2017-01-20 | Discharge: 2017-01-20 | Disposition: A | Discharge status: Home / Self Care | Payer: BLUE CROSS/BLUE SHIELD | Attending: Emergency Medicine | Admitting: Emergency Medicine

## 2017-01-20 ENCOUNTER — Encounter (HOSPITAL_COMMUNITY): Payer: Self-pay | Admitting: Emergency Medicine

## 2017-01-20 DIAGNOSIS — R109 Unspecified abdominal pain: Secondary | ICD-10-CM | POA: Diagnosis present

## 2017-01-20 DIAGNOSIS — R110 Nausea: Secondary | ICD-10-CM | POA: Insufficient documentation

## 2017-01-20 DIAGNOSIS — Z79899 Other long term (current) drug therapy: Secondary | ICD-10-CM | POA: Diagnosis not present

## 2017-01-20 DIAGNOSIS — R1084 Generalized abdominal pain: Secondary | ICD-10-CM | POA: Insufficient documentation

## 2017-01-20 LAB — COMPREHENSIVE METABOLIC PANEL
ALT: 17 U/L (ref 17–63)
ANION GAP: 7 (ref 5–15)
AST: 20 U/L (ref 15–41)
Albumin: 4.5 g/dL (ref 3.5–5.0)
Alkaline Phosphatase: 62 U/L (ref 38–126)
BUN: 16 mg/dL (ref 6–20)
CHLORIDE: 104 mmol/L (ref 101–111)
CO2: 25 mmol/L (ref 22–32)
Calcium: 9.2 mg/dL (ref 8.9–10.3)
Creatinine, Ser: 1.05 mg/dL (ref 0.61–1.24)
GFR calc non Af Amer: >60 mL/min (ref >60)
Glucose, Bld: 149 mg/dL — ABNORMAL HIGH (ref 65–99)
Potassium: 4.2 mmol/L (ref 3.5–5.1)
SODIUM: 136 mmol/L (ref 135–145)
Total Bilirubin: 0.7 mg/dL (ref 0.3–1.2)
Total Protein: 7.2 g/dL (ref 6.5–8.1)

## 2017-01-20 LAB — CBC
HCT: 42.6 % (ref 39.0–52.0)
HEMOGLOBIN: 16.1 g/dL (ref 13.0–17.0)
MCH: 33.5 pg (ref 26.0–34.0)
MCHC: 37.8 g/dL — ABNORMAL HIGH (ref 30.0–36.0)
MCV: 88.8 fL (ref 78.0–100.0)
Platelets: 186 10*3/uL (ref 150–400)
RBC: 4.8 MIL/uL (ref 4.22–5.81)
RDW: 12.4 % (ref 11.5–15.5)
WBC: 11.7 10*3/uL — ABNORMAL HIGH (ref 4.0–10.5)

## 2017-01-20 LAB — URINALYSIS, ROUTINE W REFLEX MICROSCOPIC
Bilirubin Urine: NEGATIVE
GLUCOSE, UA: 50 mg/dL — AB
HGB URINE DIPSTICK: NEGATIVE
Ketones, ur: NEGATIVE mg/dL
Leukocytes, UA: NEGATIVE
Nitrite: NEGATIVE
Protein, ur: NEGATIVE mg/dL
SPECIFIC GRAVITY, URINE: 1.01 (ref 1.005–1.030)
pH: 7 (ref 5.0–8.0)

## 2017-01-20 LAB — LIPASE, BLOOD: LIPASE: 50 U/L (ref 11–51)

## 2017-01-20 MED ORDER — HYDROMORPHONE HCL 1 MG/ML IJ SOLN
1.0000 mg | Freq: Once | INTRAMUSCULAR | Status: AC
Start: 2017-01-20 — End: 2017-01-20
  Administered 2017-01-20: 1 mg via INTRAVENOUS
  Filled 2017-01-20: qty 1

## 2017-01-20 MED ORDER — ONDANSETRON 8 MG PO TBDP: 8.0000 mg | ORAL_TABLET | Freq: Three times a day (TID) | ORAL | 0 refills | Status: DC | PRN

## 2017-01-20 MED ORDER — METHYLPREDNISOLONE SODIUM SUCC 125 MG IJ SOLR
125.0000 mg | Freq: Once | INTRAMUSCULAR | Status: AC
  Administered 2017-01-20: 125 mg via INTRAVENOUS
  Filled 2017-01-20: qty 2

## 2017-01-20 MED ORDER — ONDANSETRON 4 MG PO TBDP: 4.0000 mg | ORAL_TABLET | Freq: Once | ORAL | Status: DC | PRN

## 2017-01-20 MED ORDER — HYDROMORPHONE HCL 1 MG/ML IJ SOLN
1.0000 mg | Freq: Once | INTRAMUSCULAR | Status: AC
  Administered 2017-01-20: 1 mg via INTRAVENOUS
  Filled 2017-01-20: qty 1

## 2017-01-20 MED ORDER — SODIUM CHLORIDE 0.9 % IV BOLUS (SEPSIS)
2000.0000 mL | Freq: Once | INTRAVENOUS | Status: AC
  Administered 2017-01-20: 2000 mL via INTRAVENOUS

## 2017-01-20 MED ORDER — ONDANSETRON HCL 4 MG/2ML IJ SOLN
4.0000 mg | Freq: Once | INTRAMUSCULAR | Status: AC
  Administered 2017-01-20: 4 mg via INTRAVENOUS
  Filled 2017-01-20: qty 2

## 2017-01-20 NOTE — ED Triage Notes (Signed)
Pt reports having abd pain and vomiting for the last several weeks. Pt reports last episode of emesis earlier tonight.

## 2017-01-25 ENCOUNTER — Encounter (HOSPITAL_COMMUNITY): Payer: Self-pay | Admitting: Emergency Medicine

## 2017-01-25 ENCOUNTER — Other Ambulatory Visit: Payer: Self-pay

## 2017-01-25 ENCOUNTER — Emergency Department (HOSPITAL_COMMUNITY)
Admission: EM | Admit: 2017-01-25 | Discharge: 2017-01-25 | Disposition: A | Discharge status: Home / Self Care | Payer: BLUE CROSS/BLUE SHIELD | Attending: Emergency Medicine | Admitting: Emergency Medicine

## 2017-01-25 DIAGNOSIS — Z79899 Other long term (current) drug therapy: Secondary | ICD-10-CM | POA: Diagnosis not present

## 2017-01-25 DIAGNOSIS — R109 Unspecified abdominal pain: Secondary | ICD-10-CM | POA: Diagnosis not present

## 2017-01-25 DIAGNOSIS — F909 Attention-deficit hyperactivity disorder, unspecified type: Secondary | ICD-10-CM | POA: Diagnosis not present

## 2017-01-25 DIAGNOSIS — R1114 Bilious vomiting: Secondary | ICD-10-CM

## 2017-01-25 DIAGNOSIS — R112 Nausea with vomiting, unspecified: Secondary | ICD-10-CM | POA: Insufficient documentation

## 2017-01-25 DIAGNOSIS — R1084 Generalized abdominal pain: Secondary | ICD-10-CM

## 2017-01-25 LAB — URINALYSIS, ROUTINE W REFLEX MICROSCOPIC
Bilirubin Urine: NEGATIVE
GLUCOSE, UA: NEGATIVE mg/dL
HGB URINE DIPSTICK: NEGATIVE
KETONES UR: NEGATIVE mg/dL
LEUKOCYTES UA: NEGATIVE
Nitrite: NEGATIVE
PH: 6 (ref 5.0–8.0)
Protein, ur: NEGATIVE mg/dL
Specific Gravity, Urine: 1.013 (ref 1.005–1.030)

## 2017-01-25 LAB — CBC
HEMATOCRIT: 40.3 % (ref 39.0–52.0)
Hemoglobin: 14.7 g/dL (ref 13.0–17.0)
MCH: 32.8 pg (ref 26.0–34.0)
MCHC: 36.5 g/dL — AB (ref 30.0–36.0)
MCV: 90 fL (ref 78.0–100.0)
Platelets: 173 10*3/uL (ref 150–400)
RBC: 4.48 MIL/uL (ref 4.22–5.81)
RDW: 12.5 % (ref 11.5–15.5)
WBC: 10.7 10*3/uL — AB (ref 4.0–10.5)

## 2017-01-25 LAB — COMPREHENSIVE METABOLIC PANEL
ALT: 16 U/L — AB (ref 17–63)
AST: 22 U/L (ref 15–41)
Albumin: 3.9 g/dL (ref 3.5–5.0)
Alkaline Phosphatase: 55 U/L (ref 38–126)
Anion gap: 10 (ref 5–15)
BUN: 17 mg/dL (ref 6–20)
CHLORIDE: 102 mmol/L (ref 101–111)
CO2: 25 mmol/L (ref 22–32)
Calcium: 8.4 mg/dL — ABNORMAL LOW (ref 8.9–10.3)
Creatinine, Ser: 1.17 mg/dL (ref 0.61–1.24)
GFR calc Af Amer: >60 mL/min (ref >60)
GFR calc non Af Amer: >60 mL/min (ref >60)
GLUCOSE: 72 mg/dL (ref 65–99)
POTASSIUM: 3.5 mmol/L (ref 3.5–5.1)
Sodium: 137 mmol/L (ref 135–145)
Total Bilirubin: 0.8 mg/dL (ref 0.3–1.2)
Total Protein: 6.2 g/dL — ABNORMAL LOW (ref 6.5–8.1)

## 2017-01-25 LAB — LIPASE, BLOOD: LIPASE: 49 U/L (ref 11–51)

## 2017-01-25 MED ORDER — FENTANYL CITRATE (PF) 100 MCG/2ML IJ SOLN
50.0000 ug | Freq: Once | INTRAMUSCULAR | Status: AC
  Administered 2017-01-25: 50 ug via NASAL
  Filled 2017-01-25: qty 2

## 2017-01-25 MED ORDER — SODIUM CHLORIDE 0.9 % IV BOLUS (SEPSIS)
1000.0000 mL | Freq: Once | INTRAVENOUS | Status: AC
  Administered 2017-01-25: 1000 mL via INTRAVENOUS

## 2017-01-25 MED ORDER — METHYLPREDNISOLONE SODIUM SUCC 125 MG IJ SOLR
125.0000 mg | INTRAMUSCULAR | Status: AC
  Administered 2017-01-25: 125 mg via INTRAVENOUS
  Filled 2017-01-25: qty 2

## 2017-01-25 MED ORDER — HALOPERIDOL LACTATE 5 MG/ML IJ SOLN
2.0000 mg | Freq: Once | INTRAMUSCULAR | Status: AC
Start: 2017-01-25 — End: 2017-01-25
  Administered 2017-01-25: 2 mg via INTRAVENOUS
  Filled 2017-01-25: qty 1

## 2017-01-25 MED ORDER — HYDROMORPHONE HCL 1 MG/ML IJ SOLN
1.0000 mg | Freq: Once | INTRAMUSCULAR | Status: AC
  Administered 2017-01-25: 1 mg via INTRAVENOUS
  Filled 2017-01-25: qty 1

## 2017-01-25 NOTE — ED Triage Notes (Signed)
Pt reports having nausea, vomiting, and generalized body aches for the last several day.

## 2017-01-25 NOTE — ED Notes (Signed)
Patient was seen by Dr. Loreli Dollarampose on 01/20/17 for the abdominal patient is having right now. Patient states he has a doctor appointment on Tuesday to have some procedure for this problem. Patient states he came in because he could not wait till Wednesday. Patient is having right and left lower quadrant abdominal pain. Patient states his feet are swollen but there is no pitting or swelling seen. Patient states it hurts to walk on his feet.

## 2017-01-25 NOTE — ED Notes (Signed)
Requested urine from patient. 

## 2017-01-25 NOTE — ED Provider Notes (Signed)
[facility]-EMERGENCY DEPT Provider Note   CSN: [id number] Arrival date & time: 01/25/17  [id number]     History   Chief Complaint Chief Complaint  Patient presents with  . Abdominal Pain    HPI Gregory Conway is a 21 y.o. male.  HPI Young male with a history of ankylosing spondylitis and chronic abdominal pain presents with worsening abdominal pain. He notes that he receives care for his chronic pain from multiple locations, and is currently in the process of enrolling with the pain management specialist, and is preparing for a hypogastric nerve ablation procedure in the coming days. He is on the end of a steroid taper, but notes that over the past day or so he has had worsening pain throughout his mid abdomen. This is typical with pain exacerbations that he has experienced multiple prior times. Also complains of pain diffusely in all distal joints, with perception of swelling. No loss of sensation anywhere, no weakness, no fever He has had one episode of vomiting secondary to pain exacerbation. Patient is not currently taking pain medication regularly.  Past Medical History:  Diagnosis Date  . Acne   . ADHD   . GERD (gastroesophageal reflux disease)   . HSP (Henoch Schonlein purpura) (HCC)     There are no active problems to display for this patient.   History reviewed. No pertinent surgical history.     Home Medications    Prior to Admission medications   Medication Sig Start Date End Date Taking? Authorizing Provider  acetaminophen (TYLENOL) 325 MG tablet Take 650 mg by mouth every 6 (six) hours as needed for mild pain.    [provider]  dexmethylphenidate (FOCALIN XR) 10 MG 24 hr capsule Take 10 mg by mouth daily.  04/03/16   [provider]  HUMIRA PEN 40 MG/0.4ML PNKT Inject 40 mg every 14 (fourteen) days into the skin. 12/05/16   [provider]  Multiple Vitamins-Minerals (MULTIVITAMIN ADULT) TABS Take 1 tablet  by mouth every evening.    [provider]  ondansetron (ZOFRAN ODT) 8 MG disintegrating tablet Take 1 tablet (8 mg total) by mouth every 8 (eight) hours as needed for nausea or vomiting. 01/20/17   Azalia Bilisampos, Kevin, MD  venlafaxine XR (EFFEXOR-XR) 75 MG 24 hr capsule Take 1 capsule daily by mouth. 12/25/16   [provider]    Family History History reviewed. No pertinent family history.  Social History Social History   Tobacco Use  . Smoking status: Never Smoker  . Smokeless tobacco: Never Used  Substance Use Topics  . Alcohol use: Yes    Comment: rarely  . Drug use: No     Allergies   Morphine and related; Penicillins; and Tramadol   Review of Systems Review of Systems  Constitutional:       Per HPI, otherwise negative  HENT:       Per HPI, otherwise negative  Respiratory:       Per HPI, otherwise negative  Cardiovascular:       Per HPI, otherwise negative  Gastrointestinal: Positive for abdominal pain, nausea and vomiting.  Endocrine:       Negative aside from HPI  Genitourinary:       Neg aside from HPI   Musculoskeletal:       Per HPI, otherwise negative  Skin: Negative.   Allergic/Immunologic:       Ankylosing spondylitis, history of Henoch-Schnlein purpura  Neurological: Negative for syncope.     Physical  Exam Updated Vital Signs BP (!) 147/88   Pulse 70   Temp 98.8 F (37.1 C) (Oral)   Resp 18   Ht 6\' 1"  (1.854 m)   Wt 95.3 kg (210 lb)   SpO2 100%   BMI 27.71 kg/m   Physical Exam  Constitutional: He is oriented to person, place, and time. He appears well-developed. No distress.  HENT:  Head: Normocephalic and atraumatic.  Eyes: Conjunctivae and EOM are normal.  Cardiovascular: Normal rate and regular rhythm.  Pulmonary/Chest: Effort normal. No stridor. No respiratory distress.  Abdominal: He exhibits no distension.  Mild tenderness palpation in the area just inferior to the umbilicus, no peritonitis, no lateral abdominal  tenderness to palpation.   Musculoskeletal: He exhibits no edema.  Patient describes pain and swelling in ankles and wrists, no appreciable lesions in any of these joints.  Neurological: He is alert and oriented to person, place, and time.  Skin: Skin is warm and dry.  Psychiatric: He has a normal mood and affect.  Nursing note and vitals reviewed.    ED Treatments / Results  Labs (all labs ordered are listed, but only abnormal results are displayed) Labs Reviewed  COMPREHENSIVE METABOLIC PANEL - Abnormal; Notable for the following components:      Result Value   Calcium 8.4 (*)    Total Protein 6.2 (*)    ALT 16 (*)    All other components within normal limits  CBC - Abnormal; Notable for the following components:   WBC 10.7 (*)    MCHC 36.5 (*)    All other components within normal limits  LIPASE, BLOOD  URINALYSIS, ROUTINE W REFLEX MICROSCOPIC    Procedures Procedures (including critical care time)  Medications Ordered in ED Medications  sodium chloride 0.9 % bolus 1,000 mL (1,000 mLs Intravenous New Bag/Given 01/25/17 0804)  methylPREDNISolone sodium succinate (SOLU-MEDROL) 125 mg/2 mL injection 125 mg (125 mg Intravenous Given 01/25/17 0806)  haloperidol lactate (HALDOL) injection 2 mg (2 mg Intravenous Given 01/25/17 0809)     Initial Impression / Assessment and Plan / ED Course  I have reviewed the triage vital signs and the nursing notes.  Pertinent labs & imaging results that were available during my care of the patient were reviewed by me and considered in my medical decision making (see chart for details).   [facility], is preparing for hypogastric nerve ablation in 2 days. [facility] for pain episodes.   12:10 PM Labs unremarkable, patient feels substantially better after multiple medications.  No e/o new acute pathology, with a reassuring physical exam. Patient has follow-up scheduled in 48 hours,  was encouraged to keep this appointment. Patient discharged in stable condition. Final Clinical Impressions(s) / ED Diagnoses  Abdominal pain, diffuse, nausea, vomiting   Gerhard MunchLockwood, Cybele Maule, MD 01/25/17 1211

## 2017-01-25 NOTE — Discharge Instructions (Signed)
As discussed, your evaluation today has been largely reassuring.  But, it is important that you monitor your condition carefully, and do not hesitate to return to the ED if you develop new, or concerning changes in your condition. ? ?Otherwise, please follow-up with your physician for appropriate ongoing care. ? ?

## 2017-01-26 NOTE — ED Provider Notes (Signed)
[facility]-EMERGENCY DEPT Provider Note   CSN: [id number] Arrival date & time: 01/20/17  0012     History   Chief Complaint Chief Complaint  Patient presents with  . Abdominal Pain    HPI Gregory Conway is a 21 y.o. male.  HPI  21 year old male with long history of recurrent abdominal pain.  Currently being [facility] a [facility] with an upcoming nerve ablation scheduled.  He presents with his typical abdominal pain and associated nausea.  Reports decreased oral intake.  Feels weak and dehydrated.  Denies fevers and chills.  No urinary complaints.  Symptoms are moderate to severe in severity.  Denies back pain.  No chest pain or shortness of breath.   Past Medical History:  Diagnosis Date  . Acne   . ADHD   . GERD (gastroesophageal reflux disease)   . HSP (Henoch Schonlein purpura) (HCC)     There are no active problems to display for this patient.   History reviewed. No pertinent surgical history.     Home Medications    Prior to Admission medications   Medication Sig Start Date End Date Taking? Authorizing Provider  acetaminophen (TYLENOL) 325 MG tablet Take 650 mg by mouth every 6 (six) hours as needed for mild pain.   Yes [provider]  dexmethylphenidate (FOCALIN XR) 10 MG 24 hr capsule Take 10 mg by mouth daily.  04/03/16  Yes [provider]  HUMIRA PEN 40 MG/0.4ML PNKT Inject 40 mg every 14 (fourteen) days into the skin. 12/05/16  Yes [provider]  Multiple Vitamins-Minerals (MULTIVITAMIN ADULT) TABS Take 1 tablet by mouth every evening.   Yes [provider]  venlafaxine XR (EFFEXOR-XR) 75 MG 24 hr capsule Take 1 capsule daily by mouth. 12/25/16  Yes [provider]  lidocaine (LIDODERM) 5 % Place 1 patch onto the skin daily. Apply patch to painful area. Patch may remain in place for up to 12 hours in a 24 hour period. 01/20/17 02/19/17  [provider]    ondansetron (ZOFRAN ODT) 8 MG disintegrating tablet Take 1 tablet (8 mg total) by mouth every 8 (eight) hours as needed for nausea or vomiting. 01/20/17   Azalia Bilisampos, Kierria Feigenbaum, MD    Family History History reviewed. No pertinent family history.  Social History Social History   Tobacco Use  . Smoking status: Never Smoker  . Smokeless tobacco: Never Used  Substance Use Topics  . Alcohol use: Yes    Comment: rarely  . Drug use: No     Allergies   Morphine and related; Penicillins; and Tramadol   Review of Systems Review of Systems  All other systems reviewed and are negative.    Physical Exam Updated Vital Signs BP 127/82 (BP Location: Left Arm)   Pulse 82   Temp 98.3 F (36.8 C) (Oral)   Resp 17   Ht 6\' 1"  (1.854 m)   Wt 95.3 kg (210 lb)   SpO2 100%   BMI 27.71 kg/m   Physical Exam  Constitutional: He is oriented to person, place, and time. He appears well-developed and well-nourished.  HENT:  Head: Normocephalic and atraumatic.  Eyes: EOM are normal.  Neck: Normal range of motion.  Cardiovascular: Normal rate, regular rhythm, normal heart sounds and intact distal pulses.  Pulmonary/Chest: Effort normal and breath sounds normal. No respiratory distress.  Abdominal: Soft. He exhibits no distension. There is no tenderness.  Musculoskeletal: Normal range of motion.  Neurological: He is  alert and oriented to person, place, and time.  Skin: Skin is warm and dry.  Psychiatric: He has a normal mood and affect. Judgment normal.  Nursing note and vitals reviewed.    ED Treatments / Results  Labs (all labs ordered are listed, but only abnormal results are displayed) Labs Reviewed  COMPREHENSIVE METABOLIC PANEL - Abnormal; Notable for the following components:      Result Value   Glucose, Bld 149 (*)    All other components within normal limits  CBC - Abnormal; Notable for the following components:   WBC 11.7 (*)    MCHC 37.8 (*)    All other components within  normal limits  URINALYSIS, ROUTINE W REFLEX MICROSCOPIC - Abnormal; Notable for the following components:   Glucose, UA 50 (*)    All other components within normal limits  LIPASE, BLOOD    EKG  EKG Interpretation None       Radiology No results found.  Procedures Procedures (including critical care time)  Medications Ordered in ED Medications  sodium chloride 0.9 % bolus 2,000 mL (0 mLs Intravenous Stopped 01/20/17 0616)  HYDROmorphone (DILAUDID) injection 1 mg (1 mg Intravenous Given 01/20/17 0221)  methylPREDNISolone sodium succinate (SOLU-MEDROL) 125 mg/2 mL injection 125 mg (125 mg Intravenous Given 01/20/17 0221)  ondansetron (ZOFRAN) injection 4 mg (4 mg Intravenous Given 01/20/17 0220)  HYDROmorphone (DILAUDID) injection 1 mg (1 mg Intravenous Given 01/20/17 0331)  HYDROmorphone (DILAUDID) injection 1 mg (1 mg Intravenous Given 01/20/17 0620)     Initial Impression / Assessment and Plan / ED Course  I have reviewed the triage vital signs and the nursing notes.  Pertinent labs & imaging results that were available during my care of the patient were reviewed by me and considered in my medical decision making (see chart for details).     Reassuring abdominal exam.  No indication for advanced imaging.  History of recurrent abdominal pain.  Patient will need follow-up with his multiple subspecialists.  Symptomatic treatment in the ER with improvement in symptoms  Final Clinical Impressions(s) / ED Diagnoses   Final diagnoses:  Nausea  Abdominal pain  Generalized abdominal pain    ED Discharge Orders        Ordered    ondansetron (ZOFRAN ODT) 8 MG disintegrating tablet  Every 8 hours PRN     01/20/17 0709       Azalia Bilisampos, Shams Fill, MD 01/26/17 [phone]

## 2017-03-15 ENCOUNTER — Emergency Department (HOSPITAL_COMMUNITY)
Admission: EM | Admit: 2017-03-15 | Discharge: 2017-03-15 | Discharge status: Against Medical Advice | Payer: BLUE CROSS/BLUE SHIELD

## 2017-03-15 NOTE — ED Triage Notes (Signed)
Pt called 3 x for triage, lwbs, lwb triaged.

## 2017-03-15 NOTE — ED Notes (Signed)
No answer when called for triage 

## 2017-03-31 ENCOUNTER — Emergency Department (HOSPITAL_COMMUNITY)
Admission: EM | Admit: 2017-03-31 | Discharge: 2017-03-31 | Discharge status: Absent without leave | Payer: BLUE CROSS/BLUE SHIELD

## 2017-05-25 ENCOUNTER — Encounter (HOSPITAL_COMMUNITY): Payer: Self-pay

## 2017-05-25 ENCOUNTER — Other Ambulatory Visit: Payer: Self-pay

## 2017-05-25 ENCOUNTER — Emergency Department (HOSPITAL_COMMUNITY)
Admission: EM | Admit: 2017-05-25 | Discharge: 2017-05-25 | Disposition: A | Discharge status: Home / Self Care | Payer: BLUE CROSS/BLUE SHIELD | Attending: Emergency Medicine | Admitting: Emergency Medicine

## 2017-05-25 DIAGNOSIS — F909 Attention-deficit hyperactivity disorder, unspecified type: Secondary | ICD-10-CM | POA: Diagnosis not present

## 2017-05-25 DIAGNOSIS — R1030 Lower abdominal pain, unspecified: Secondary | ICD-10-CM | POA: Diagnosis not present

## 2017-05-25 DIAGNOSIS — R112 Nausea with vomiting, unspecified: Secondary | ICD-10-CM | POA: Diagnosis not present

## 2017-05-25 DIAGNOSIS — Z79899 Other long term (current) drug therapy: Secondary | ICD-10-CM | POA: Diagnosis not present

## 2017-05-25 LAB — COMPREHENSIVE METABOLIC PANEL
ALT: 16 U/L — AB (ref 17–63)
AST: 24 U/L (ref 15–41)
Albumin: 4.2 g/dL (ref 3.5–5.0)
Alkaline Phosphatase: 68 U/L (ref 38–126)
Anion gap: 11 (ref 5–15)
BILIRUBIN TOTAL: 1.2 mg/dL (ref 0.3–1.2)
BUN: 15 mg/dL (ref 6–20)
CHLORIDE: 107 mmol/L (ref 101–111)
CO2: 21 mmol/L — ABNORMAL LOW (ref 22–32)
CREATININE: 1.01 mg/dL (ref 0.61–1.24)
Calcium: 9.4 mg/dL (ref 8.9–10.3)
Glucose, Bld: 96 mg/dL (ref 65–99)
POTASSIUM: 4.3 mmol/L (ref 3.5–5.1)
Sodium: 139 mmol/L (ref 135–145)
TOTAL PROTEIN: 7.2 g/dL (ref 6.5–8.1)

## 2017-05-25 LAB — CBC
HCT: 43.4 % (ref 39.0–52.0)
Hemoglobin: 15.9 g/dL (ref 13.0–17.0)
MCH: 32.8 pg (ref 26.0–34.0)
MCHC: 36.6 g/dL — ABNORMAL HIGH (ref 30.0–36.0)
MCV: 89.5 fL (ref 78.0–100.0)
PLATELETS: 82 10*3/uL — AB (ref 150–400)
RBC: 4.85 MIL/uL (ref 4.22–5.81)
RDW: 12 % (ref 11.5–15.5)
WBC: 14.7 10*3/uL — AB (ref 4.0–10.5)

## 2017-05-25 LAB — LIPASE, BLOOD: LIPASE: 35 U/L (ref 11–51)

## 2017-05-25 MED ORDER — SODIUM CHLORIDE 0.9 % IV BOLUS
1000.0000 mL | Freq: Once | INTRAVENOUS | Status: AC
  Administered 2017-05-25: 1000 mL via INTRAVENOUS

## 2017-05-25 MED ORDER — ONDANSETRON HCL 4 MG PO TABS: 4.0000 mg | ORAL_TABLET | Freq: Three times a day (TID) | ORAL | 0 refills | Status: DC | PRN

## 2017-05-25 MED ORDER — HYDROMORPHONE HCL 1 MG/ML IJ SOLN
1.0000 mg | Freq: Once | INTRAMUSCULAR | Status: AC
  Administered 2017-05-25: 1 mg via INTRAVENOUS
  Filled 2017-05-25: qty 1

## 2017-05-25 MED ORDER — ONDANSETRON HCL 4 MG/2ML IJ SOLN
4.0000 mg | Freq: Once | INTRAMUSCULAR | Status: AC
  Administered 2017-05-25: 4 mg via INTRAVENOUS
  Filled 2017-05-25: qty 2

## 2017-05-25 NOTE — ED Triage Notes (Signed)
Patient c/o lower abdominal pain and vomiting x 4-5 days.

## 2017-05-25 NOTE — ED Provider Notes (Signed)
[facility]-EMERGENCY DEPT Provider Note   CSN: [id number] Arrival date & time: 05/25/17  0750     History   Chief Complaint Chief Complaint  Patient presents with  . Abdominal Pain  . Emesis    HPI Gregory Conway is a 22 y.o. male presenting for evaluation of lower abd pain and n/v.  Patient states he has a history of chronic abdominal pain.  He has occasional flares that he is unable to manage at home.  Over the past 4-5 days, he has had severe lower abdominal pain.  It is constant, but fluctuating in severity.  Nothing makes it better or worse.  He has been taking his at home medications without improvement.  Due to the pain, he reports associated nausea and vomiting.  He denies fevers, chills, chest pain, shortness of breath, cough, abnormal urination, abnormal bowel movements.  He has a history of HSP.  He has had a extensive workup by gastroenterology without significant findings for this abdominal pain. He denies sick contacts. No h/o abd surgeries.  HPI  Past Medical History:  Diagnosis Date  . Acne   . ADHD   . GERD (gastroesophageal reflux disease)   . HSP (Henoch Schonlein purpura) (HCC)     There are no active problems to display for this patient.   History reviewed. No pertinent surgical history.      Home Medications    Prior to Admission medications   Medication Sig Start Date End Date Taking? Authorizing Provider  acetaminophen (TYLENOL) 325 MG tablet Take 650 mg by mouth every 6 (six) hours as needed for mild pain.   Yes [provider]  dexmethylphenidate (FOCALIN XR) 10 MG 24 hr capsule Take 10 mg by mouth daily.  04/03/16  Yes [provider]  HUMIRA PEN 40 MG/0.4ML PNKT Inject 40 mg every 14 (fourteen) days into the skin. 12/05/16  Yes [provider]  linaclotide (LINZESS) 72 MCG capsule Take 75 mg by mouth daily. 03/13/17  Yes [provider]  Multiple Vitamins-Minerals (MULTIVITAMIN ADULT)  TABS Take 1 tablet by mouth every evening.   Yes [provider]  ondansetron (ZOFRAN ODT) 8 MG disintegrating tablet Take 1 tablet (8 mg total) by mouth every 8 (eight) hours as needed for nausea or vomiting. 01/20/17  Yes Azalia Bilis, MD  ondansetron (ZOFRAN) 4 MG tablet Take 1 tablet (4 mg total) by mouth every 8 (eight) hours as needed for nausea or vomiting. 05/25/17   Daron Stutz, PA-C    Family History Family History  Problem Relation Age of Onset  . Lupus Mother   . Hypertension Father     Social History Social History   Tobacco Use  . Smoking status: Never Smoker  . Smokeless tobacco: Never Used  Substance Use Topics  . Alcohol use: Yes    Comment: rarely  . Drug use: No     Allergies   Morphine and related; Penicillins; and Tramadol   Review of Systems Review of Systems  Gastrointestinal: Positive for abdominal pain, nausea and vomiting.  All other systems reviewed and are negative.    Physical Exam Updated Vital Signs BP 130/79   Pulse 99   Temp 98 F (36.7 C) (Oral)   Resp 20   Ht 6' (1.829 m)   Wt 97.5 kg (215 lb)   SpO2 98%   BMI 29.16 kg/m   Physical Exam  Constitutional: He is oriented to person, place, and time. He appears well-developed and well-nourished.  No distress.  Patient appears uncomfortable due to pain, no acute distress.  HENT:  Head: Normocephalic and atraumatic.  Eyes: Pupils are equal, round, and reactive to light. Conjunctivae and EOM are normal.  Neck: Normal range of motion. Neck supple.  Cardiovascular: Normal rate, regular rhythm and intact distal pulses.  Pulmonary/Chest: Effort normal and breath sounds normal. No respiratory distress. He has no wheezes.  Abdominal: Soft. Bowel sounds are normal. He exhibits no distension and no mass. There is tenderness. There is guarding.  Tenderness to palpation of lower abdomen laterally.  Voluntary guarding.  Abdomen soft without rigidity or distention.  Normoactive  bowel sounds x4.  Musculoskeletal: Normal range of motion.  Neurological: He is alert and oriented to person, place, and time.  Skin: Skin is warm and dry.  Psychiatric: He has a normal mood and affect.  Nursing note and vitals reviewed.    ED Treatments / Results  Labs (all labs ordered are listed, but only abnormal results are displayed) Labs Reviewed  COMPREHENSIVE METABOLIC PANEL - Abnormal; Notable for the following components:      Result Value   CO2 21 (*)    ALT 16 (*)    All other components within normal limits  CBC - Abnormal; Notable for the following components:   WBC 14.7 (*)    MCHC 36.6 (*)    Platelets 82 (*)    All other components within normal limits  LIPASE, BLOOD  URINALYSIS, ROUTINE W REFLEX MICROSCOPIC    EKG None  Radiology No results found.  Procedures Procedures (including critical care time)  Medications Ordered in ED Medications  sodium chloride 0.9 % bolus 1,000 mL (0 mLs Intravenous Stopped 05/25/17 1055)  ondansetron (ZOFRAN) injection 4 mg (4 mg Intravenous Given 05/25/17 1016)  HYDROmorphone (DILAUDID) injection 1 mg (1 mg Intravenous Given 05/25/17 1016)  HYDROmorphone (DILAUDID) injection 1 mg (1 mg Intravenous Given 05/25/17 1206)  sodium chloride 0.9 % bolus 1,000 mL (0 mLs Intravenous Stopped 05/25/17 1235)  HYDROmorphone (DILAUDID) injection 1 mg (1 mg Intravenous Given 05/25/17 1353)     Initial Impression / Assessment and Plan / ED Course  I have reviewed the triage vital signs and the nursing notes.  Pertinent labs & imaging results that were available during my care of the patient were reviewed by me and considered in my medical decision making (see chart for details).     Patient presenting for evaluation of lower abdominal pain.  Physical exam shows patient who appears uncomfortable but in no acute distress.  He is afebrile not tachycardic.  Tenderness to palpation of lower abdomen with voluntary guarding.  He has had significant  workup by both [facility] and week for his abdominal pain.  Will obtain basic labs, start IV fluids, Zofran, and dilaudid for sx control.  Patient reports mild improvement with Dilaudid, but pain is still very strong.  Labs show elevated white count of 14.2, decreased platelets at 82.  Discussed findings with patient, and recommended scan for further evaluation.  Patient states he has had multiple scans, does not want a repeat scan today.  Will give second dose of medication and reassess.  Zofran resolved nausea.  On reassessment, pain improved, but still at 6/10. Pt concerned that if he goes home now, he will have to return due to increased pain.  He believes with final dose, he will be able to manage his pain at home without returning.  Discussed importance of pain management by one doctor open (primary care).  Patient follow-up with primary care as needed for further pain management.  At this time, patient appears safe for discharge.  Return precautions given.  Patient states he understands and agrees to plan.   Final Clinical Impressions(s) / ED Diagnoses   Final diagnoses:  Lower abdominal pain  Non-intractable vomiting with nausea, unspecified vomiting type    ED Discharge Orders        Ordered    ondansetron (ZOFRAN) 4 MG tablet  Every 8 hours PRN     05/25/17 1308       Mkayla Steele, PA-C 05/25/17 1557    Samuel JesterMcManus, Kathleen, DO 05/26/17 1306

## 2017-05-25 NOTE — Discharge Instructions (Signed)
Follow-up with your primary care doctor for further evaluation of pain and need for pain medication. Use zofran as needed for nausea or vomiting. Try to stay well-hydrated with water. Return to the emergency room for persistent vomiting despite medication, severe worsening pain, or any new or concerning symptoms.

## 2017-05-25 NOTE — ED Notes (Signed)
Discharge instructions reviewed with patient. Patient verbalizes understanding. VSS.   

## 2017-05-28 ENCOUNTER — Emergency Department (HOSPITAL_COMMUNITY)
Admission: EM | Admit: 2017-05-28 | Discharge: 2017-05-28 | Disposition: A | Discharge status: Home / Self Care | Payer: BLUE CROSS/BLUE SHIELD | Attending: Emergency Medicine | Admitting: Emergency Medicine

## 2017-05-28 ENCOUNTER — Encounter (HOSPITAL_COMMUNITY): Payer: Self-pay | Admitting: Emergency Medicine

## 2017-05-28 ENCOUNTER — Other Ambulatory Visit: Payer: Self-pay

## 2017-05-28 ENCOUNTER — Emergency Department (HOSPITAL_COMMUNITY): Payer: BLUE CROSS/BLUE SHIELD

## 2017-05-28 DIAGNOSIS — Z79899 Other long term (current) drug therapy: Secondary | ICD-10-CM | POA: Insufficient documentation

## 2017-05-28 DIAGNOSIS — G8929 Other chronic pain: Secondary | ICD-10-CM

## 2017-05-28 DIAGNOSIS — R1084 Generalized abdominal pain: Secondary | ICD-10-CM | POA: Insufficient documentation

## 2017-05-28 DIAGNOSIS — R109 Unspecified abdominal pain: Secondary | ICD-10-CM

## 2017-05-28 DIAGNOSIS — R1110 Vomiting, unspecified: Secondary | ICD-10-CM | POA: Insufficient documentation

## 2017-05-28 LAB — CBC WITH DIFFERENTIAL/PLATELET
Basophils Absolute: 0 10*3/uL (ref 0.0–0.1)
Basophils Relative: 0 %
Eosinophils Absolute: 0.1 10*3/uL (ref 0.0–0.7)
Eosinophils Relative: 2 %
HCT: 41.8 % (ref 39.0–52.0)
Hemoglobin: 15.2 g/dL (ref 13.0–17.0)
Lymphocytes Relative: 32 %
Lymphs Abs: 1.9 10*3/uL (ref 0.7–4.0)
MCH: 32.8 pg (ref 26.0–34.0)
MCHC: 36.4 g/dL — ABNORMAL HIGH (ref 30.0–36.0)
MCV: 90.1 fL (ref 78.0–100.0)
Monocytes Absolute: 0.5 10*3/uL (ref 0.1–1.0)
Monocytes Relative: 9 %
Neutro Abs: 3.4 10*3/uL (ref 1.7–7.7)
Neutrophils Relative %: 57 %
Platelets: 193 10*3/uL (ref 150–400)
RBC: 4.64 MIL/uL (ref 4.22–5.81)
RDW: 11.9 % (ref 11.5–15.5)
WBC: 5.9 10*3/uL (ref 4.0–10.5)

## 2017-05-28 LAB — URINALYSIS, ROUTINE W REFLEX MICROSCOPIC
Bilirubin Urine: NEGATIVE
Glucose, UA: NEGATIVE mg/dL
Hgb urine dipstick: NEGATIVE
Ketones, ur: NEGATIVE mg/dL
Leukocytes, UA: NEGATIVE
Nitrite: NEGATIVE
Protein, ur: NEGATIVE mg/dL
Specific Gravity, Urine: 1.011 (ref 1.005–1.030)
pH: 7 (ref 5.0–8.0)

## 2017-05-28 LAB — COMPREHENSIVE METABOLIC PANEL
ALT: 21 U/L (ref 17–63)
AST: 21 U/L (ref 15–41)
Albumin: 4.4 g/dL (ref 3.5–5.0)
Alkaline Phosphatase: 65 U/L (ref 38–126)
Anion gap: 9 (ref 5–15)
BUN: 13 mg/dL (ref 6–20)
CO2: 29 mmol/L (ref 22–32)
Calcium: 9.3 mg/dL (ref 8.9–10.3)
Chloride: 103 mmol/L (ref 101–111)
Creatinine, Ser: 1.2 mg/dL (ref 0.61–1.24)
GFR calc Af Amer: >60 mL/min (ref >60)
GFR calc non Af Amer: >60 mL/min (ref >60)
Glucose, Bld: 85 mg/dL (ref 65–99)
Potassium: 3.4 mmol/L — ABNORMAL LOW (ref 3.5–5.1)
Sodium: 141 mmol/L (ref 135–145)
Total Bilirubin: 0.8 mg/dL (ref 0.3–1.2)
Total Protein: 7 g/dL (ref 6.5–8.1)

## 2017-05-28 LAB — RAPID URINE DRUG SCREEN, HOSP PERFORMED
Amphetamines: NOT DETECTED
Barbiturates: NOT DETECTED
Benzodiazepines: NOT DETECTED
Cocaine: NOT DETECTED
Opiates: NOT DETECTED
Tetrahydrocannabinol: NOT DETECTED

## 2017-05-28 MED ORDER — KETOROLAC TROMETHAMINE 30 MG/ML IJ SOLN
30.0000 mg | Freq: Once | INTRAMUSCULAR | Status: AC
  Administered 2017-05-28: 30 mg via INTRAVENOUS
  Filled 2017-05-28: qty 1

## 2017-05-28 MED ORDER — HYDROMORPHONE HCL 1 MG/ML IJ SOLN
INTRAMUSCULAR | Status: DC
Start: 2017-05-28 — End: 2017-05-28
  Filled 2017-05-28: qty 1

## 2017-05-28 MED ORDER — IOPAMIDOL (ISOVUE-300) INJECTION 61%
INTRAVENOUS | Status: AC
  Filled 2017-05-28: qty 100

## 2017-05-28 MED ORDER — IOPAMIDOL (ISOVUE-300) INJECTION 61%: 100.0000 mL | Freq: Once | INTRAVENOUS | Status: DC | PRN

## 2017-05-28 MED ORDER — SODIUM CHLORIDE 0.9 % IV BOLUS
1000.0000 mL | Freq: Once | INTRAVENOUS | Status: AC
  Administered 2017-05-28: 1000 mL via INTRAVENOUS

## 2017-05-28 MED ORDER — PREDNISONE 50 MG PO TABS: 50.0000 mg | ORAL_TABLET | Freq: Every day | ORAL | 0 refills | Status: DC

## 2017-05-28 MED ORDER — FENTANYL CITRATE (PF) 100 MCG/2ML IJ SOLN
100.0000 ug | Freq: Once | INTRAMUSCULAR | Status: AC
  Administered 2017-05-28: 100 ug via INTRAVENOUS
  Filled 2017-05-28: qty 2

## 2017-05-28 MED ORDER — HYDROMORPHONE HCL 1 MG/ML IJ SOLN
0.5000 mg | Freq: Once | INTRAMUSCULAR | Status: AC
  Administered 2017-05-28: 0.5 mg via INTRAVENOUS

## 2017-05-28 MED ORDER — PROMETHAZINE HCL 25 MG PO TABS: 25.0000 mg | ORAL_TABLET | Freq: Four times a day (QID) | ORAL | 0 refills | Status: DC | PRN

## 2017-05-28 MED ORDER — METOCLOPRAMIDE HCL 5 MG/ML IJ SOLN
10.0000 mg | Freq: Once | INTRAMUSCULAR | Status: AC
  Administered 2017-05-28: 10 mg via INTRAVENOUS
  Filled 2017-05-28: qty 2

## 2017-05-28 MED ORDER — HYDROMORPHONE HCL 1 MG/ML IJ SOLN
1.0000 mg | Freq: Once | INTRAMUSCULAR | Status: AC
  Administered 2017-05-28: 1 mg via INTRAVENOUS
  Filled 2017-05-28: qty 1

## 2017-05-28 NOTE — ED Triage Notes (Signed)
Pt c/o abd pains and vomiting for 2 days.

## 2017-05-28 NOTE — Discharge Instructions (Addendum)
You will need to follow-up with your primary doctor and specialist. return here as needed.  Increase your fluid intake

## 2017-05-28 NOTE — ED Notes (Signed)
Made Thayer OhmChris PA aware that patient requesting pain medications.  No new orders at this time.

## 2017-06-01 IMAGING — CT CT ABD-PELV W/ CM
2 of 4 series · 16 of 46 positions shown, 18 images · IV contrast (ISOVUE)
Comparison: 05/11/2016

CLINICAL DATA: Pain with nausea and vomiting since yesterday

EXAM:
CT ABDOMEN AND PELVIS WITH CONTRAST
TECHNIQUE: Multidetector CT imaging of the abdomen and pelvis was performed
using the standard protocol following bolus administration of
intravenous contrast.
CONTRAST:  100mL WN2W83-TPP IOPAMIDOL (WN2W83-TPP) INJECTION 61%

[Series 2: abd/pel with · axial · 0.71mm/px · z∈[-511,-76]mm · 13 of 97 slices shown, 15 images]
[im 5/97  soft-tissue]
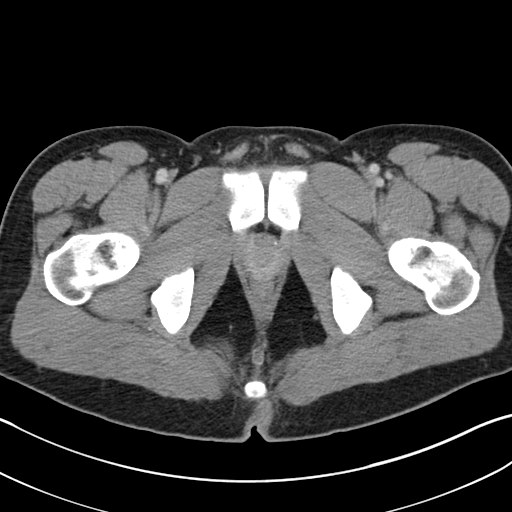
[im 5/97  bone]
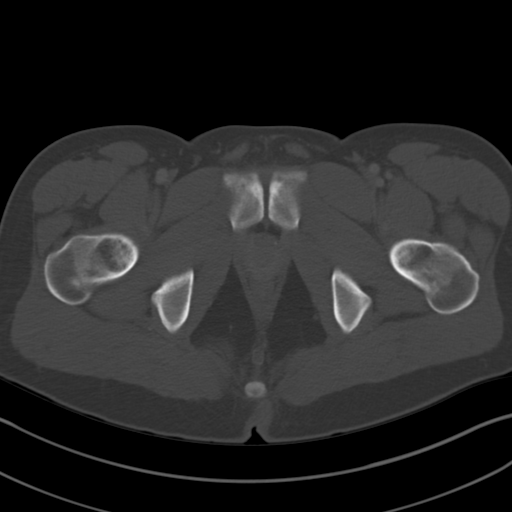
[im 15/97  soft-tissue]
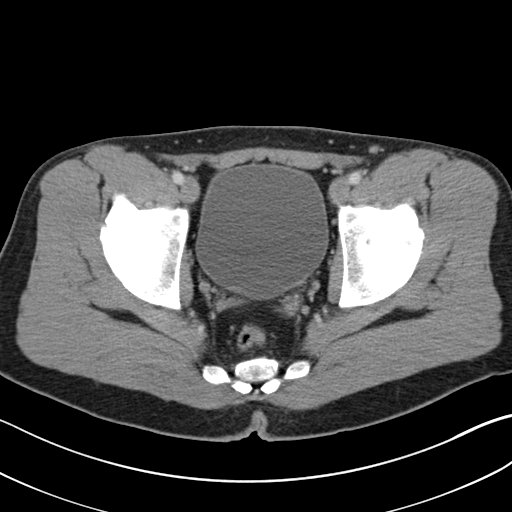
[im 20/97  soft-tissue]
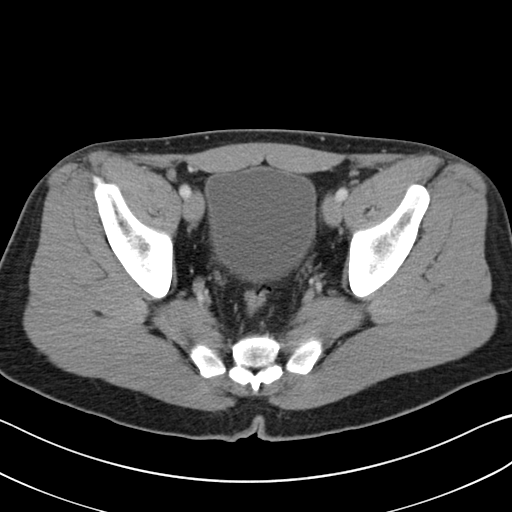
[im 29/97  soft-tissue]
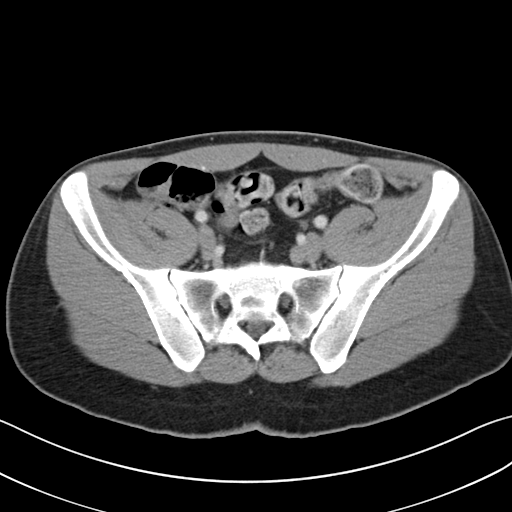
[im 34/97  soft-tissue]
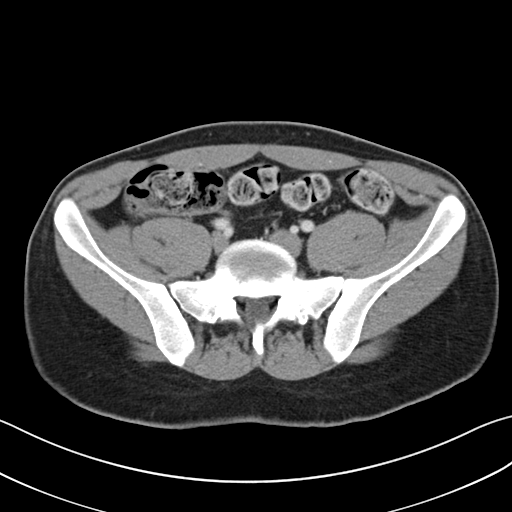
[im 44/97  soft-tissue]
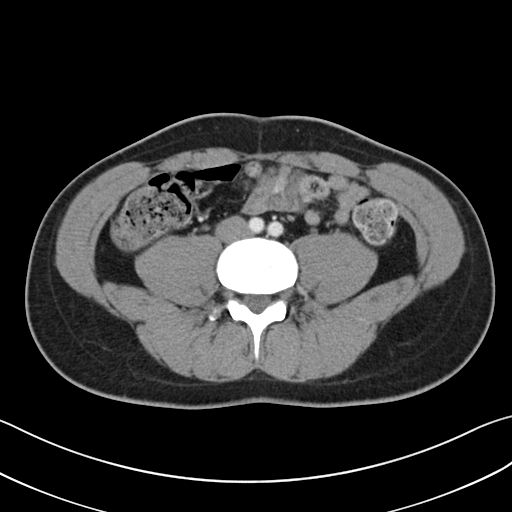
[im 49/97  soft-tissue]
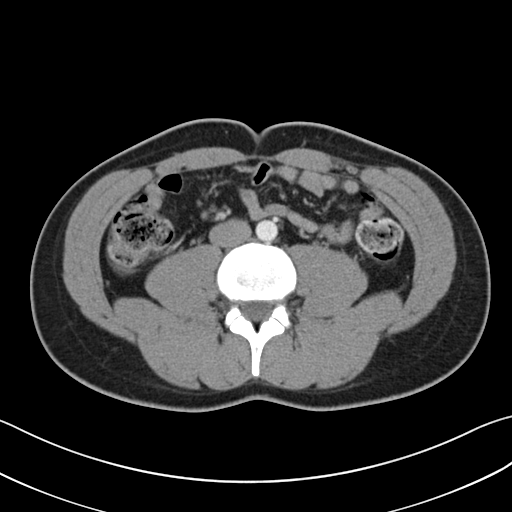
[im 53/97  soft-tissue]
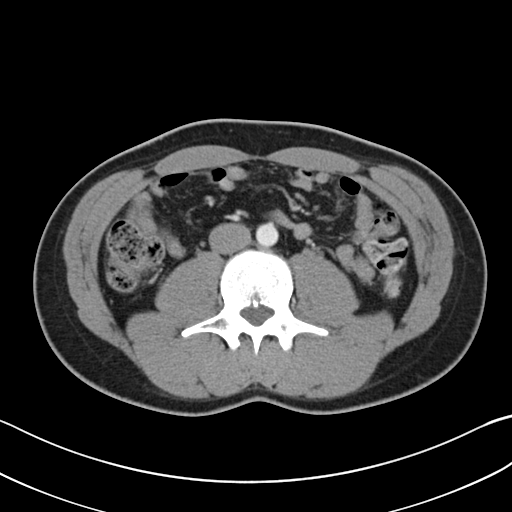
[im 63/97  soft-tissue]
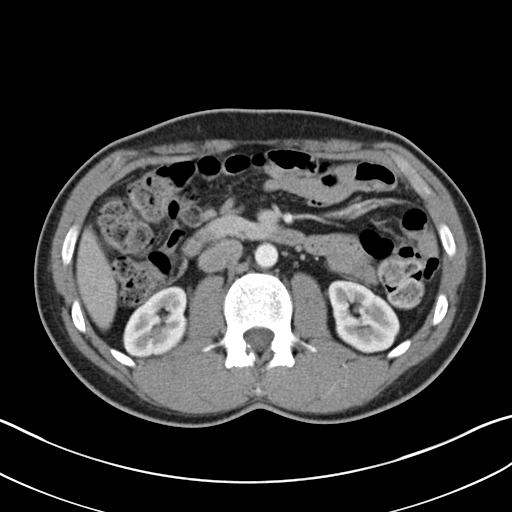
[im 63/97  bone]
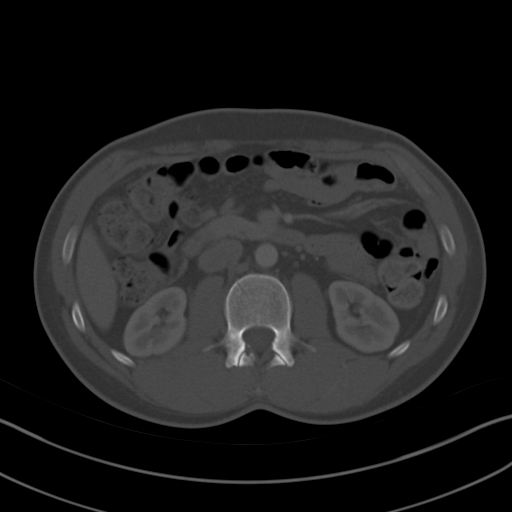
[im 68/97  soft-tissue]
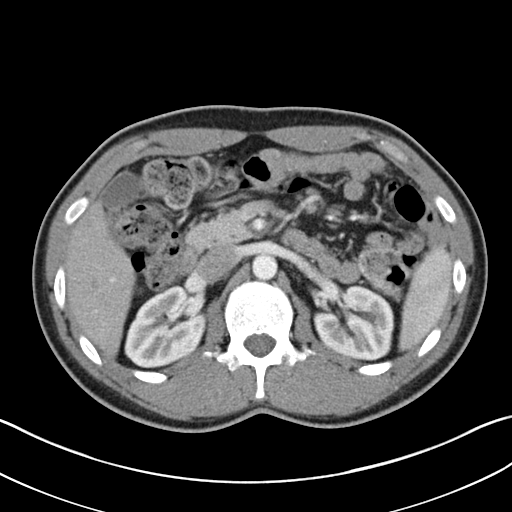
[im 77/97  soft-tissue]
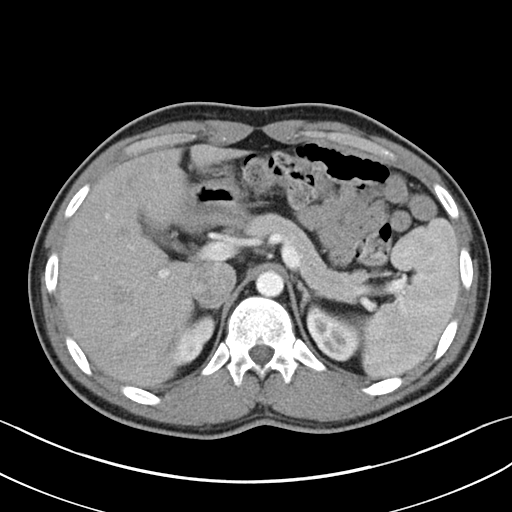
[im 82/97  soft-tissue]
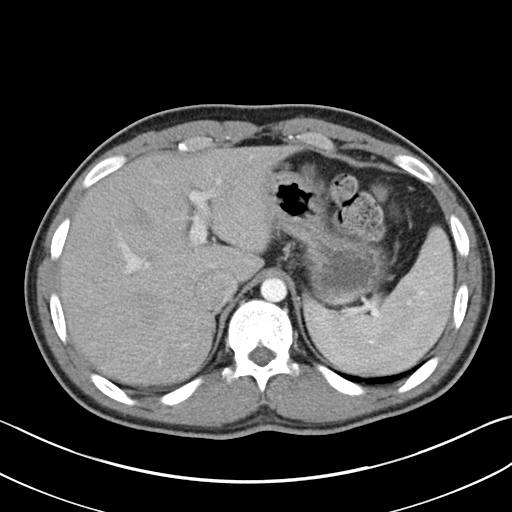
[im 92/97  soft-tissue]
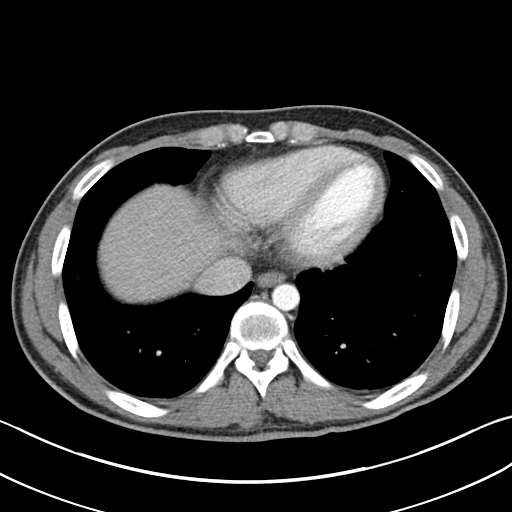

[Series 4: coronal a/|p · coronal · 0.74mm/px · 3 of 110 slices shown]
[im 37/110  soft-tissue]
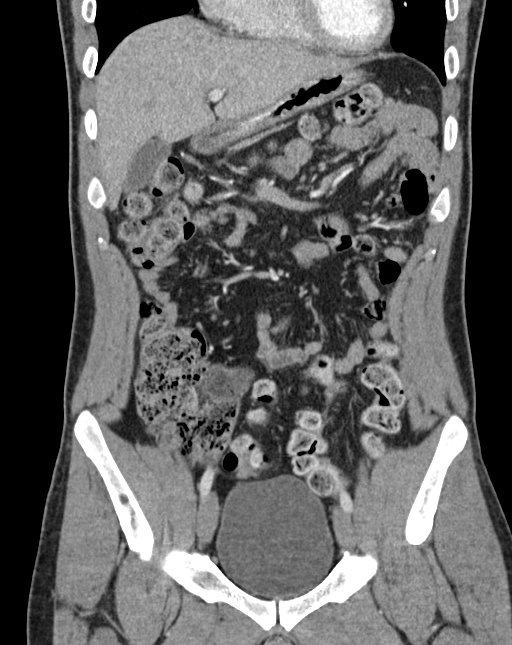
[im 49/110  soft-tissue]
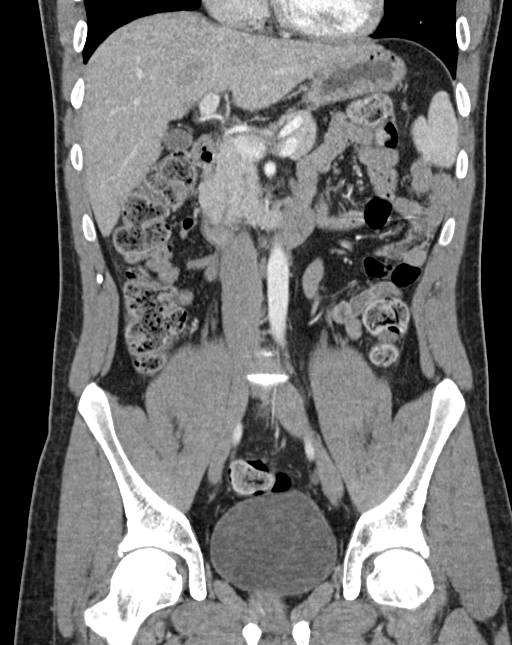
[im 61/110  soft-tissue]
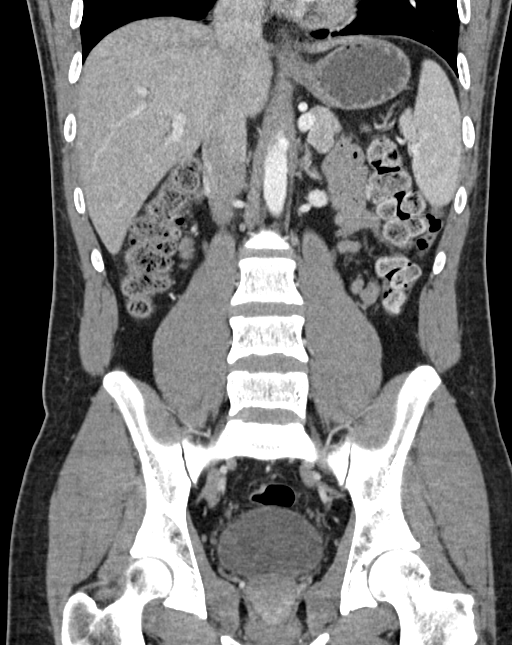

[16 of 46 positions shown; findings below may reference images not displayed]

FINDINGS: Lower chest: No acute abnormality.

Hepatobiliary: No focal liver abnormality is seen. No gallstones,
gallbladder wall thickening, or biliary dilatation.

Pancreas: Unremarkable. No pancreatic ductal dilatation or
surrounding inflammatory changes.

Spleen: Normal in size without focal abnormality.

Adrenals/Urinary Tract: Adrenal glands are unremarkable. Kidneys are
normal, without renal calculi, focal lesion, or hydronephrosis.
Bladder is unremarkable.

Stomach/Bowel: The stomach is physiologically distended. There is
normal small bowel rotation without inflammation or obstruction.
There is stool throughout large bowel from cecum to rectum
consistent constipation. Normal appendix

Vascular/Lymphatic: No significant vascular findings are present. No
enlarged abdominal or pelvic lymph nodes.

Reproductive: Prostate is unremarkable.

Other: No abdominal wall hernia or abnormality. No abdominopelvic
ascites.

Musculoskeletal: Nonacute
IMPRESSION: Increased colonic stool burden consistent constipation. No bowel
obstruction or inflammation.

## 2017-06-06 NOTE — ED Provider Notes (Signed)
[facility]-EMERGENCY DEPT Provider Note   CSN: [id number] Arrival date & time: 05/28/17  [id number]     History   Chief Complaint Chief Complaint  Patient presents with  . Abdominal Pain  . Emesis    HPI Gregory Conway is a 22 y.o. male.  HPI Patient presents to the emergency department with abdominal pain and vomiting for 2 days.  The patient was seen recently for similar complaints.  Patient states that nothing seemed to make the condition better or worse.  The patient states that he has chronic abdominal issues and has seen multiple specialists for this.  The patient states that his home medications did not seem to help with the pain.  The patient denies chest pain, shortness of breath, headache,blurred vision, neck pain, fever, cough, weakness, numbness, dizziness, anorexia, edema,  diarrhea, rash, back pain, dysuria, hematemesis, bloody stool, near syncope, or syncope. Past Medical History:  Diagnosis Date  . Acne   . ADHD   . GERD (gastroesophageal reflux disease)   . HSP (Henoch Schonlein purpura) (HCC)     There are no active problems to display for this patient.   History reviewed. No pertinent surgical history.      Home Medications    Prior to Admission medications   Medication Sig Start Date End Date Taking? Authorizing Provider  acetaminophen (TYLENOL) 325 MG tablet Take 650 mg by mouth every 6 (six) hours as needed for mild pain.   Yes [provider]  dexmethylphenidate (FOCALIN XR) 10 MG 24 hr capsule Take 10 mg by mouth daily.  04/03/16  Yes [provider]  HUMIRA PEN 40 MG/0.4ML PNKT Inject 40 mg every 14 (fourteen) days into the skin. 12/05/16  Yes [provider]  linaclotide (LINZESS) 72 MCG capsule Take 75 mg by mouth daily. 03/13/17  Yes [provider]  Multiple Vitamins-Minerals (MULTIVITAMIN ADULT) TABS Take 1 tablet by mouth every evening.   Yes [provider]  ondansetron (ZOFRAN) 4  MG tablet Take 1 tablet (4 mg total) by mouth every 8 (eight) hours as needed for nausea or vomiting. 05/25/17  Yes Caccavale, Sophia, PA-C  ondansetron (ZOFRAN ODT) 8 MG disintegrating tablet Take 1 tablet (8 mg total) by mouth every 8 (eight) hours as needed for nausea or vomiting. Patient not taking: Reported on 05/28/2017 01/20/17   Azalia Bilisampos, Kevin, MD  predniSONE (DELTASONE) 50 MG tablet Take 1 tablet (50 mg total) by mouth daily. 05/28/17   Kama Cammarano, Cristal Deerhristopher, PA-C  promethazine (PHENERGAN) 25 MG tablet Take 1 tablet (25 mg total) by mouth every 6 (six) hours as needed for nausea or vomiting. 05/28/17   Charlestine NightLawyer, Kylon Philbrook, PA-C    Family History Family History  Problem Relation Age of Onset  . Lupus Mother   . Hypertension Father     Social History Social History   Tobacco Use  . Smoking status: Never Smoker  . Smokeless tobacco: Never Used  Substance Use Topics  . Alcohol use: Yes    Comment: rarely  . Drug use: No     Allergies   Morphine and related; Penicillins; and Tramadol   Review of Systems Review of Systems All other systems negative except as documented in the HPI. All pertinent positives and negatives as reviewed in the HPI.  Physical Exam Updated Vital Signs BP 118/76 (BP Location: Left Arm)   Pulse 67   Temp (!) 97.4 F (36.3 C) (Oral)   Resp 17   Ht 6' (1.829 m)  Wt 97.5 kg (215 lb)   SpO2 99%   BMI 29.16 kg/m   Physical Exam  Constitutional: He is oriented to person, place, and time. He appears well-developed and well-nourished. No distress.  HENT:  Head: Normocephalic and atraumatic.  Mouth/Throat: Oropharynx is clear and moist.  Eyes: Pupils are equal, round, and reactive to light.  Neck: Normal range of motion. Neck supple.  Cardiovascular: Normal rate, regular rhythm and normal heart sounds. Exam reveals no gallop and no friction rub.  No murmur heard. Pulmonary/Chest: Effort normal and breath sounds normal. No respiratory distress. He has no  wheezes.  Abdominal: Soft. Bowel sounds are normal. He exhibits no distension. There is generalized tenderness. There is no rigidity, no rebound and no guarding. No hernia.  Neurological: He is alert and oriented to person, place, and time. He exhibits normal muscle tone. Coordination normal.  Skin: Skin is warm and dry. Capillary refill takes less than 2 seconds. No rash noted. No erythema.  Psychiatric: He has a normal mood and affect. His behavior is normal.  Nursing note and vitals reviewed.    ED Treatments / Results  Labs (all labs ordered are listed, but only abnormal results are displayed) Labs Reviewed  CBC WITH DIFFERENTIAL/PLATELET - Abnormal; Notable for the following components:      Result Value   MCHC 36.4 (*)    All other components within normal limits  COMPREHENSIVE METABOLIC PANEL - Abnormal; Notable for the following components:   Potassium 3.4 (*)    All other components within normal limits  URINALYSIS, ROUTINE W REFLEX MICROSCOPIC  RAPID URINE DRUG SCREEN, HOSP PERFORMED    EKG None  Radiology No results found.  Procedures Procedures (including critical care time)  Medications Ordered in ED Medications  fentaNYL (SUBLIMAZE) injection 100 mcg (100 mcg Intravenous Given 05/28/17 0940)  metoCLOPramide (REGLAN) injection 10 mg (10 mg Intravenous Given 05/28/17 0939)  sodium chloride 0.9 % bolus 1,000 mL (0 mLs Intravenous Stopped 05/28/17 1042)  HYDROmorphone (DILAUDID) injection 1 mg (1 mg Intravenous Given 05/28/17 1140)  ketorolac (TORADOL) 30 MG/ML injection 30 mg (30 mg Intravenous Given 05/28/17 1440)  HYDROmorphone (DILAUDID) injection 0.5 mg (0.5 mg Intravenous Given 05/28/17 1510)     Initial Impression / Assessment and Plan / ED Course  I have reviewed the triage vital signs and the nursing notes.  Pertinent labs & imaging results that were available during my care of the patient were reviewed by me and considered in my medical decision making (see  chart for details).    Patient has a very extensive history of chronic abdominal pain and has been seen at multiple facilities and seen multiple specialist.  In reviewing his chart the patient has very specific requests for pain medications when he is seen in our facilities.  The patient told me that Dilaudid is what usually helps with his condition.  Patient states that any other pain medications do not seem to touch his pain.  Patient states that he is due to follow-up with his gastroenterologist soon.  I have advised the patient of her course here in the emergency department that we will not be providing hours an hours worth of pain control once his laboratory testing and imaging have returned we will make decisions at that point.  The patient refuses CT scan stating that he has had CT scans in the past.  I advised him that this is is not helpful due to the fact that he is now had 2 visits  in 3 days and that we needed further imaging for decision-making purposes and he still refuses.  The patient is discharged home and advised that he will need to follow-up with his GI specialist.    Final Clinical Impressions(s) / ED Diagnoses   Final diagnoses:  Chronic abdominal pain    ED Discharge Orders        Ordered    predniSONE (DELTASONE) 50 MG tablet  Daily     05/28/17 1417    promethazine (PHENERGAN) 25 MG tablet  Every 6 hours PRN     05/28/17 1417       Charlestine Night, PA-C 06/06/17 1610    Mancel Bale, MD 06/06/17 2245

## 2017-06-13 ENCOUNTER — Other Ambulatory Visit: Payer: Self-pay

## 2017-06-13 ENCOUNTER — Emergency Department (HOSPITAL_COMMUNITY)
Admission: EM | Admit: 2017-06-13 | Discharge: 2017-06-13 | Disposition: A | Discharge status: Home / Self Care | Payer: BLUE CROSS/BLUE SHIELD | Attending: Emergency Medicine | Admitting: Emergency Medicine

## 2017-06-13 ENCOUNTER — Encounter (HOSPITAL_COMMUNITY): Payer: Self-pay | Admitting: *Deleted

## 2017-06-13 DIAGNOSIS — F909 Attention-deficit hyperactivity disorder, unspecified type: Secondary | ICD-10-CM | POA: Insufficient documentation

## 2017-06-13 DIAGNOSIS — Z79899 Other long term (current) drug therapy: Secondary | ICD-10-CM | POA: Diagnosis not present

## 2017-06-13 DIAGNOSIS — R1030 Lower abdominal pain, unspecified: Secondary | ICD-10-CM | POA: Insufficient documentation

## 2017-06-13 DIAGNOSIS — R109 Unspecified abdominal pain: Secondary | ICD-10-CM

## 2017-06-13 LAB — LIPASE, BLOOD: LIPASE: 45 U/L (ref 11–51)

## 2017-06-13 LAB — COMPREHENSIVE METABOLIC PANEL
ALT: 19 U/L (ref 17–63)
ANION GAP: 9 (ref 5–15)
AST: 20 U/L (ref 15–41)
Albumin: 4.1 g/dL (ref 3.5–5.0)
Alkaline Phosphatase: 60 U/L (ref 38–126)
BILIRUBIN TOTAL: 0.6 mg/dL (ref 0.3–1.2)
BUN: 13 mg/dL (ref 6–20)
CHLORIDE: 104 mmol/L (ref 101–111)
CO2: 25 mmol/L (ref 22–32)
Calcium: 8.8 mg/dL — ABNORMAL LOW (ref 8.9–10.3)
Creatinine, Ser: 1.2 mg/dL (ref 0.61–1.24)
GFR calc Af Amer: >60 mL/min (ref >60)
Glucose, Bld: 81 mg/dL (ref 65–99)
POTASSIUM: 3.8 mmol/L (ref 3.5–5.1)
Sodium: 138 mmol/L (ref 135–145)
TOTAL PROTEIN: 6.8 g/dL (ref 6.5–8.1)

## 2017-06-13 LAB — CBC
HEMATOCRIT: 41.6 % (ref 39.0–52.0)
HEMOGLOBIN: 14.8 g/dL (ref 13.0–17.0)
MCH: 31.8 pg (ref 26.0–34.0)
MCHC: 35.6 g/dL (ref 30.0–36.0)
MCV: 89.5 fL (ref 78.0–100.0)
Platelets: 187 10*3/uL (ref 150–400)
RBC: 4.65 MIL/uL (ref 4.22–5.81)
RDW: 12.2 % (ref 11.5–15.5)
WBC: 5.1 10*3/uL (ref 4.0–10.5)

## 2017-06-13 LAB — URINALYSIS, ROUTINE W REFLEX MICROSCOPIC
Bilirubin Urine: NEGATIVE
GLUCOSE, UA: NEGATIVE mg/dL
Hgb urine dipstick: NEGATIVE
KETONES UR: NEGATIVE mg/dL
LEUKOCYTES UA: NEGATIVE
NITRITE: NEGATIVE
PH: 5 (ref 5.0–8.0)
Protein, ur: NEGATIVE mg/dL
Specific Gravity, Urine: 1.021 (ref 1.005–1.030)

## 2017-06-13 MED ORDER — ONDANSETRON HCL 4 MG/2ML IJ SOLN
4.0000 mg | Freq: Once | INTRAMUSCULAR | Status: AC
  Administered 2017-06-13: 4 mg via INTRAVENOUS
  Filled 2017-06-13: qty 2

## 2017-06-13 MED ORDER — SODIUM CHLORIDE 0.9 % IV BOLUS
1000.0000 mL | Freq: Once | INTRAVENOUS | Status: AC
  Administered 2017-06-13: 1000 mL via INTRAVENOUS

## 2017-06-13 MED ORDER — ONDANSETRON 4 MG PO TBDP: 4.0000 mg | ORAL_TABLET | Freq: Three times a day (TID) | ORAL | 0 refills | Status: DC | PRN

## 2017-06-13 MED ORDER — HYDROMORPHONE HCL 1 MG/ML IJ SOLN
1.0000 mg | Freq: Once | INTRAMUSCULAR | Status: AC
  Administered 2017-06-13: 1 mg via INTRAVENOUS
  Filled 2017-06-13: qty 1

## 2017-06-13 MED ORDER — HYDROMORPHONE HCL 1 MG/ML IJ SOLN
0.5000 mg | Freq: Once | INTRAMUSCULAR | Status: AC
  Administered 2017-06-13: 0.5 mg via INTRAVENOUS
  Filled 2017-06-13: qty 1

## 2017-06-13 MED ORDER — OXYCODONE-ACETAMINOPHEN 5-325 MG PO TABS: 1.0000 | ORAL_TABLET | ORAL | 0 refills | Status: DC | PRN

## 2017-06-13 NOTE — ED Provider Notes (Signed)
[facility]-EMERGENCY DEPT Provider Note   CSN: [id number] Arrival date & time: 06/13/17  4098     History   Chief Complaint Chief Complaint  Patient presents with  . Abdominal Pain    HPI Gregory Conway is a 22 y.o. male.  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  He follows up at [facility] for this problem.  He has had several CT scans in the past.  He reports a third visit to the emergency department in 3 months for similar symptoms.  He has scheduled a consulting visit at the [facility].  Severity of pain is moderate.  Nothing makes symptoms better or worse.     Past Medical History:  Diagnosis Date  . Acne   . ADHD   . GERD (gastroesophageal reflux disease)   . HSP (Henoch Schonlein purpura) (HCC)     There are no active problems to display for this patient.   History reviewed. No pertinent surgical history.      Home Medications    Prior to Admission medications   Medication Sig Start Date End Date Taking? Authorizing Provider  acetaminophen (TYLENOL) 325 MG tablet Take 650 mg by mouth every 6 (six) hours as needed for mild pain.   Yes [provider]  ALPRAZolam Prudy Feeler) 0.5 MG tablet Take 0.5 mg by mouth 2 (two) times daily as needed. 06/05/17 07/05/17 Yes [provider]  citalopram (CELEXA) 20 MG tablet Take 20 mg by mouth daily. 06/05/17  Yes [provider]  dexmethylphenidate (FOCALIN XR) 10 MG 24 hr capsule Take 10 mg by mouth daily.  04/03/16  Yes [provider]  HUMIRA PEN 40 MG/0.4ML PNKT Inject 40 mg every 14 (fourteen) days into the skin. 12/05/16  Yes [provider]  linaclotide (LINZESS) 72 MCG capsule Take 72 mcg by mouth daily.  03/13/17  Yes [provider]  Multiple Vitamins-Minerals (MULTIVITAMIN ADULT) TABS Take 1 tablet by mouth every evening.   Yes [provider]  sulfaSALAzine  (AZULFIDINE) 500 MG EC tablet Take 500 mg by mouth daily. 05/27/17  Yes [provider]  ondansetron (ZOFRAN ODT) 4 MG disintegrating tablet Take 1 tablet (4 mg total) by mouth every 8 (eight) hours as needed for nausea or vomiting. 06/13/17   Donnetta Hutching, MD  ondansetron (ZOFRAN) 4 MG tablet Take 1 tablet (4 mg total) by mouth every 8 (eight) hours as needed for nausea or vomiting. 05/25/17   Caccavale, Sophia, PA-C  oxyCODONE-acetaminophen (PERCOCET/ROXICET) 5-325 MG tablet Take 1-2 tablets by mouth every 4 (four) hours as needed for severe pain. 06/13/17   Donnetta Hutching, MD  predniSONE (DELTASONE) 50 MG tablet Take 1 tablet (50 mg total) by mouth daily. 05/28/17   Lawyer, Cristal Deer, PA-C  promethazine (PHENERGAN) 25 MG tablet Take 1 tablet (25 mg total) by mouth every 6 (six) hours as needed for nausea or vomiting. 05/28/17   Charlestine Night, PA-C    Family History Family History  Problem Relation Age of Onset  . Lupus Mother   . Hypertension Father     Social History Social History   Tobacco Use  . Smoking status: Never Smoker  . Smokeless tobacco: Never Used  Substance Use Topics  . Alcohol use: Yes    Comment: rarely  . Drug use: No     Allergies   Morphine; Morphine and related; Penicillins; and Tramadol   Review of Systems Review of Systems  All other systems reviewed and are negative.    Physical Exam Updated Vital Signs BP 118/72   Pulse 75   Temp 97.6 F (36.4 C) (Oral)   Resp 18   Ht 6\' 1"  (1.854 m)   Wt 97.5 kg (215 lb)   SpO2 97%   BMI 28.37 kg/m   Physical Exam  Constitutional: He is oriented to person, place, and time. He appears well-developed and well-nourished.  HENT:  Head: Normocephalic and atraumatic.  Eyes: Conjunctivae are normal.  Neck: Neck supple.  Cardiovascular: Normal rate and regular rhythm.  Pulmonary/Chest: Effort normal and breath sounds normal.  Abdominal: Soft. Bowel sounds are normal.  Minimal tenderness lower  abdomen.  Musculoskeletal: Normal range of motion.  Neurological: He is alert and oriented to person, place, and time.  Skin: Skin is warm and dry.  Psychiatric: He has a normal mood and affect. His behavior is normal.  Nursing note and vitals reviewed.    ED Treatments / Results  Labs (all labs ordered are listed, but only abnormal results are displayed) Labs Reviewed  COMPREHENSIVE METABOLIC PANEL - Abnormal; Notable for the following components:      Result Value   Calcium 8.8 (*)    All other components within normal limits  LIPASE, BLOOD  CBC  URINALYSIS, ROUTINE W REFLEX MICROSCOPIC    EKG None  Radiology No results found.  Procedures Procedures (including critical care time)  Medications Ordered in ED Medications  ondansetron (ZOFRAN) injection 4 mg (4 mg Intravenous Given 06/13/17 1143)  sodium chloride 0.9 % bolus 1,000 mL (0 mLs Intravenous Stopped 06/13/17 1323)  HYDROmorphone (DILAUDID) injection 1 mg (1 mg Intravenous Given 06/13/17 1145)  HYDROmorphone (DILAUDID) injection 1 mg (1 mg Intravenous Given 06/13/17 1347)  HYDROmorphone (DILAUDID) injection 0.5 mg (0.5 mg Intravenous Given 06/13/17 1508)     Initial Impression / Assessment and Plan / ED Course  I have reviewed the triage vital signs and the nursing notes.  Pertinent labs & imaging results that were available during my care of the patient were reviewed by me and considered in my medical decision making (see chart for details).     Patient reports intermittent chronic lower abdominal pain.  Vital signs are stable.  Laboratory evaluation including blood work and urinalysis negative.  CT scan was offered to the patient, but he refused.  He was given IV fluids and pain management.  Discharge medications Percocet and Zofran 4 mg.  Patient understands to return if worse.  Final Clinical Impressions(s) / ED Diagnoses   Final diagnoses:  Abdominal pain, unspecified abdominal location    ED Discharge  Orders        Ordered    oxyCODONE-acetaminophen (PERCOCET/ROXICET) 5-325 MG tablet  Every 4 hours PRN     06/13/17 1455    ondansetron (ZOFRAN ODT) 4 MG disintegrating tablet  Every 8 hours PRN     06/13/17 1455       Donnetta Hutchingook, Primrose Oler, MD 06/13/17 1516

## 2017-06-13 NOTE — Discharge Instructions (Addendum)
Tests showed no life-threatening condition.  Meds for pain and nausea.  Try a probiotic.  Follow-up your doctor.

## 2017-06-13 NOTE — ED Triage Notes (Signed)
Pt has severe abd pain related to his HSP, he is having N/V due to the abd pain

## 2017-07-06 ENCOUNTER — Encounter (HOSPITAL_COMMUNITY): Payer: Self-pay | Admitting: Emergency Medicine

## 2017-07-06 ENCOUNTER — Emergency Department (HOSPITAL_COMMUNITY)
Admission: EM | Admit: 2017-07-06 | Discharge: 2017-07-06 | Disposition: A | Discharge status: Home / Self Care | Payer: BLUE CROSS/BLUE SHIELD | Attending: Emergency Medicine | Admitting: Emergency Medicine

## 2017-07-06 DIAGNOSIS — Z79899 Other long term (current) drug therapy: Secondary | ICD-10-CM | POA: Diagnosis not present

## 2017-07-06 DIAGNOSIS — R1030 Lower abdominal pain, unspecified: Secondary | ICD-10-CM | POA: Diagnosis not present

## 2017-07-06 DIAGNOSIS — F909 Attention-deficit hyperactivity disorder, unspecified type: Secondary | ICD-10-CM | POA: Insufficient documentation

## 2017-07-06 LAB — CBC
HCT: 40.9 % (ref 39.0–52.0)
HEMOGLOBIN: 14.8 g/dL (ref 13.0–17.0)
MCH: 32.7 pg (ref 26.0–34.0)
MCHC: 36.2 g/dL — ABNORMAL HIGH (ref 30.0–36.0)
MCV: 90.3 fL (ref 78.0–100.0)
Platelets: 181 10*3/uL (ref 150–400)
RBC: 4.53 MIL/uL (ref 4.22–5.81)
RDW: 12.8 % (ref 11.5–15.5)
WBC: 6.3 10*3/uL (ref 4.0–10.5)

## 2017-07-06 LAB — COMPREHENSIVE METABOLIC PANEL
ALBUMIN: 4.5 g/dL (ref 3.5–5.0)
ALT: 24 U/L (ref 17–63)
ANION GAP: 9 (ref 5–15)
AST: 23 U/L (ref 15–41)
Alkaline Phosphatase: 62 U/L (ref 38–126)
BUN: 13 mg/dL (ref 6–20)
CALCIUM: 9.2 mg/dL (ref 8.9–10.3)
CO2: 27 mmol/L (ref 22–32)
Chloride: 102 mmol/L (ref 101–111)
Creatinine, Ser: 1.28 mg/dL — ABNORMAL HIGH (ref 0.61–1.24)
GFR calc non Af Amer: >60 mL/min (ref >60)
Glucose, Bld: 96 mg/dL (ref 65–99)
Potassium: 4 mmol/L (ref 3.5–5.1)
SODIUM: 138 mmol/L (ref 135–145)
TOTAL PROTEIN: 7.1 g/dL (ref 6.5–8.1)
Total Bilirubin: 1 mg/dL (ref 0.3–1.2)

## 2017-07-06 LAB — LIPASE, BLOOD: Lipase: 39 U/L (ref 11–51)

## 2017-07-06 MED ORDER — OXYCODONE-ACETAMINOPHEN 5-325 MG PO TABS: 2.0000 | ORAL_TABLET | ORAL | 0 refills | Status: DC | PRN

## 2017-07-06 MED ORDER — HYDROMORPHONE HCL 1 MG/ML IJ SOLN
0.5000 mg | Freq: Once | INTRAMUSCULAR | Status: AC
  Administered 2017-07-06: 0.5 mg via INTRAVENOUS
  Filled 2017-07-06: qty 1

## 2017-07-06 MED ORDER — SODIUM CHLORIDE 0.9 % IV BOLUS
2000.0000 mL | Freq: Once | INTRAVENOUS | Status: AC
  Administered 2017-07-06: 2000 mL via INTRAVENOUS

## 2017-07-06 MED ORDER — SODIUM CHLORIDE 0.9 % IV BOLUS
1000.0000 mL | Freq: Once | INTRAVENOUS | Status: AC
  Administered 2017-07-06: 1000 mL via INTRAVENOUS

## 2017-07-06 MED ORDER — HYDROMORPHONE HCL 1 MG/ML IJ SOLN
1.0000 mg | Freq: Once | INTRAMUSCULAR | Status: AC
  Administered 2017-07-06: 1 mg via INTRAVENOUS
  Filled 2017-07-06: qty 1

## 2017-07-06 MED ORDER — ONDANSETRON HCL 4 MG/2ML IJ SOLN
4.0000 mg | Freq: Once | INTRAMUSCULAR | Status: AC
  Administered 2017-07-06: 4 mg via INTRAVENOUS
  Filled 2017-07-06: qty 2

## 2017-07-06 NOTE — ED Notes (Signed)
Pt was notified that urine needed to be collected

## 2017-07-06 NOTE — ED Provider Notes (Signed)
[facility]-EMERGENCY DEPT Provider Note   CSN: [id number] Arrival date & time: 07/06/17  7829     History   Chief Complaint Chief Complaint  Patient presents with  . Abdominal Pain  . Vomiting    HPI Gregory Conway is a 22 y.o. male.  Patient complains of abdominal discomfort.  Patient has HSP and sees a doctor at [facility] for his chronic abdominal discomfort  The history is provided by the patient. No language interpreter was used.  Abdominal Pain   This is a chronic problem. The problem occurs constantly. The problem has not changed since onset.The pain is associated with an unknown factor. The pain is located in the generalized abdominal region. The quality of the pain is aching. The pain is at a severity of 6/10. The pain is severe. Pertinent negatives include belching, diarrhea, frequency, hematuria and headaches. Nothing aggravates the symptoms.    Past Medical History:  Diagnosis Date  . Acne   . ADHD   . GERD (gastroesophageal reflux disease)   . HSP (Henoch Schonlein purpura) (HCC)     There are no active problems to display for this patient.   No past surgical history on file.      Home Medications    Prior to Admission medications   Medication Sig Start Date End Date Taking? Authorizing Provider  acetaminophen (TYLENOL) 325 MG tablet Take 650 mg by mouth every 6 (six) hours as needed for mild pain.   Yes [provider]  citalopram (CELEXA) 20 MG tablet Take 20 mg by mouth daily. 06/05/17  Yes [provider]  dexmethylphenidate (FOCALIN XR) 10 MG 24 hr capsule Take 10 mg by mouth daily.  04/03/16  Yes [provider]  linaclotide (LINZESS) 72 MCG capsule Take 72 mcg by mouth daily.  03/13/17  Yes [provider]  Multiple Vitamins-Minerals (MULTIVITAMIN ADULT) TABS Take 1 tablet by mouth every evening.   Yes [provider]  ondansetron (ZOFRAN ODT) 4 MG disintegrating tablet Take 1 tablet (4  mg total) by mouth every 8 (eight) hours as needed for nausea or vomiting. 06/13/17  Yes Donnetta Hutching, MD  OVER THE COUNTER MEDICATION Take 1 drop by mouth daily. CBD Oil   Yes [provider]  sulfaSALAzine (AZULFIDINE) 500 MG EC tablet Take 1,000 mg by mouth daily.  05/27/17  Yes [provider]  HUMIRA PEN 40 MG/0.4ML PNKT Inject 40 mg every 14 (fourteen) days into the skin. 12/05/16   [provider]  ondansetron (ZOFRAN) 4 MG tablet Take 1 tablet (4 mg total) by mouth every 8 (eight) hours as needed for nausea or vomiting. Patient not taking: Reported on 07/06/2017 05/25/17   Caccavale, Sophia, PA-C  oxyCODONE-acetaminophen (PERCOCET) 5-325 MG tablet Take 2 tablets by mouth every 4 (four) hours as needed. 07/06/17   Bethann Berkshire, MD  predniSONE (DELTASONE) 50 MG tablet Take 1 tablet (50 mg total) by mouth daily. Patient not taking: Reported on 07/06/2017 05/28/17   Lawyer, Cristal Deer, PA-C  promethazine (PHENERGAN) 25 MG tablet Take 1 tablet (25 mg total) by mouth every 6 (six) hours as needed for nausea or vomiting. 05/28/17   Charlestine Night, PA-C    Family History Family History  Problem Relation Age of Onset  . Lupus Mother   . Hypertension Father     Social History Social History   Tobacco Use  . Smoking status: Never Smoker  . Smokeless tobacco: Never Used  Substance Use Topics  . Alcohol  use: Yes    Comment: rarely  . Drug use: No     Allergies   Morphine; Morphine and related; Penicillins; and Tramadol   Review of Systems Review of Systems  Constitutional: Negative for appetite change and fatigue.  HENT: Negative for congestion, ear discharge and sinus pressure.   Eyes: Negative for discharge.  Respiratory: Negative for cough.   Cardiovascular: Negative for chest pain.  Gastrointestinal: Positive for abdominal pain. Negative for diarrhea.  Genitourinary: Negative for frequency and hematuria.  Musculoskeletal: Negative for back pain.    Skin: Negative for rash.  Neurological: Negative for seizures and headaches.  Psychiatric/Behavioral: Negative for hallucinations.     Physical Exam Updated Vital Signs BP 107/67   Pulse 77   Temp 97.9 F (36.6 C) (Oral)   Resp 16   Ht  (1.854 m)   Wt 97.5 kg (215 lb)   SpO2 97%   BMI 28.37 kg/m   Physical Exam  Constitutional: He is oriented to person, place, and time. He appears well-developed.  HENT:  Head: Normocephalic.  Eyes: Conjunctivae and EOM are normal. No scleral icterus.  Neck: Neck supple. No thyromegaly present.  Cardiovascular: Normal rate and regular rhythm. Exam reveals no gallop and no friction rub.  No murmur heard. Pulmonary/Chest: No stridor. He has no wheezes. He has no rales. He exhibits no tenderness.  Abdominal: He exhibits no distension. There is no tenderness. There is no rebound.  Musculoskeletal: Normal range of motion. He exhibits no edema.  Lymphadenopathy:    He has no cervical adenopathy.  Neurological: He is oriented to person, place, and time. He exhibits normal muscle tone. Coordination normal.  Skin: No rash noted. No erythema.  Psychiatric: He has a normal mood and affect. His behavior is normal.     ED Treatments / Results  Labs (all labs ordered are listed, but only abnormal results are displayed) Labs Reviewed  COMPREHENSIVE METABOLIC PANEL - Abnormal; Notable for the following components:      Result Value   Creatinine, Ser 1.28 (*)    All other components within normal limits  CBC - Abnormal; Notable for the following components:   MCHC 36.2 (*)    All other components within normal limits  LIPASE, BLOOD    EKG None  Radiology No results found.  Procedures Procedures (including critical care time)  Medications Ordered in ED Medications  sodium chloride 0.9 % bolus 1,000 mL (1,000 mLs Intravenous New Bag/Given 07/06/17 1439)  HYDROmorphone (DILAUDID) injection 0.5 mg (has no administration in time range)   HYDROmorphone (DILAUDID) injection 1 mg (1 mg Intravenous Given 07/06/17 1249)  ondansetron (ZOFRAN) injection 4 mg (4 mg Intravenous Given 07/06/17 1245)  sodium chloride 0.9 % bolus 2,000 mL (0 mLs Intravenous Stopped 07/06/17 1439)  HYDROmorphone (DILAUDID) injection 1 mg (1 mg Intravenous Given 07/06/17 1355)  ondansetron (ZOFRAN) injection 4 mg (4 mg Intravenous Given 07/06/17 1351)     Initial Impression / Assessment and Plan / ED Course  I have reviewed the triage vital signs and the nursing notes.  Pertinent labs & imaging results that were available during my care of the patient were reviewed by me and considered in my medical decision making (see chart for details).     Labs unremarkable.  Patient improved with pain medicine nausea medicine.  Suspect pain is related to his chronic problem with HSP  Final Clinical Impressions(s) / ED Diagnoses   Final diagnoses:  Lower abdominal pain    ED Discharge  Orders        Ordered    oxyCODONE-acetaminophen (PERCOCET) 5-325 MG tablet  Every 4 hours PRN     07/06/17 1522       Bethann Berkshire, MD 07/06/17 1525

## 2017-07-06 NOTE — Discharge Instructions (Addendum)
Follow-up with your primary care doctor or your doctor in [place]

## 2017-07-06 NOTE — ED Triage Notes (Addendum)
Pt c/o abd pains with n/v for couple days. reports that has 2 autoimmune problems and one that causes the abd pains with n/v.

## 2017-08-19 ENCOUNTER — Emergency Department (HOSPITAL_COMMUNITY)
Admission: EM | Admit: 2017-08-19 | Discharge: 2017-08-19 | Disposition: A | Discharge status: Home / Self Care | Payer: BLUE CROSS/BLUE SHIELD | Attending: Emergency Medicine | Admitting: Emergency Medicine

## 2017-08-19 ENCOUNTER — Encounter (HOSPITAL_COMMUNITY): Payer: Self-pay

## 2017-08-19 ENCOUNTER — Other Ambulatory Visit: Payer: Self-pay

## 2017-08-19 DIAGNOSIS — R1032 Left lower quadrant pain: Secondary | ICD-10-CM | POA: Insufficient documentation

## 2017-08-19 DIAGNOSIS — Z79899 Other long term (current) drug therapy: Secondary | ICD-10-CM | POA: Diagnosis not present

## 2017-08-19 DIAGNOSIS — G8929 Other chronic pain: Secondary | ICD-10-CM | POA: Diagnosis not present

## 2017-08-19 DIAGNOSIS — R109 Unspecified abdominal pain: Secondary | ICD-10-CM

## 2017-08-19 DIAGNOSIS — D690 Allergic purpura: Secondary | ICD-10-CM | POA: Diagnosis not present

## 2017-08-19 LAB — URINALYSIS, ROUTINE W REFLEX MICROSCOPIC
Bilirubin Urine: NEGATIVE
GLUCOSE, UA: NEGATIVE mg/dL
Hgb urine dipstick: NEGATIVE
KETONES UR: NEGATIVE mg/dL
LEUKOCYTES UA: NEGATIVE
NITRITE: NEGATIVE
PROTEIN: NEGATIVE mg/dL
Specific Gravity, Urine: 1.013 (ref 1.005–1.030)
pH: 7 (ref 5.0–8.0)

## 2017-08-19 LAB — COMPREHENSIVE METABOLIC PANEL
ALT: 24 U/L (ref 0–44)
AST: 28 U/L (ref 15–41)
Albumin: 4.6 g/dL (ref 3.5–5.0)
Alkaline Phosphatase: 62 U/L (ref 38–126)
Anion gap: 6 (ref 5–15)
BUN: 13 mg/dL (ref 6–20)
CALCIUM: 9.3 mg/dL (ref 8.9–10.3)
CHLORIDE: 106 mmol/L (ref 98–111)
CO2: 29 mmol/L (ref 22–32)
CREATININE: 1.14 mg/dL (ref 0.61–1.24)
GFR calc non Af Amer: >60 mL/min (ref >60)
Glucose, Bld: 86 mg/dL (ref 70–99)
Potassium: 3.2 mmol/L — ABNORMAL LOW (ref 3.5–5.1)
SODIUM: 141 mmol/L (ref 135–145)
TOTAL PROTEIN: 7.5 g/dL (ref 6.5–8.1)
Total Bilirubin: 1.2 mg/dL (ref 0.3–1.2)

## 2017-08-19 LAB — CBC
HCT: 42.3 % (ref 39.0–52.0)
Hemoglobin: 15.5 g/dL (ref 13.0–17.0)
MCH: 33.7 pg (ref 26.0–34.0)
MCHC: 36.6 g/dL — ABNORMAL HIGH (ref 30.0–36.0)
MCV: 92 fL (ref 78.0–100.0)
PLATELETS: 186 10*3/uL (ref 150–400)
RBC: 4.6 MIL/uL (ref 4.22–5.81)
RDW: 12.6 % (ref 11.5–15.5)
WBC: 5.9 10*3/uL (ref 4.0–10.5)

## 2017-08-19 LAB — LIPASE, BLOOD: LIPASE: 40 U/L (ref 11–51)

## 2017-08-19 MED ORDER — HYDROMORPHONE HCL 1 MG/ML IJ SOLN
1.0000 mg | Freq: Once | INTRAMUSCULAR | Status: AC
  Administered 2017-08-19: 1 mg via INTRAVENOUS
  Filled 2017-08-19: qty 1

## 2017-08-19 MED ORDER — ONDANSETRON HCL 4 MG PO TABS: 4.0000 mg | ORAL_TABLET | Freq: Three times a day (TID) | ORAL | 0 refills | Status: DC | PRN

## 2017-08-19 MED ORDER — SODIUM CHLORIDE 0.9 % IV BOLUS
1000.0000 mL | Freq: Once | INTRAVENOUS | Status: AC
Start: 2017-08-19 — End: 2017-08-19
  Administered 2017-08-19: 1000 mL via INTRAVENOUS

## 2017-08-19 MED ORDER — POTASSIUM CHLORIDE CRYS ER 20 MEQ PO TBCR
40.0000 meq | EXTENDED_RELEASE_TABLET | Freq: Once | ORAL | Status: AC
Start: 2017-08-19 — End: 2017-08-19
  Administered 2017-08-19: 40 meq via ORAL
  Filled 2017-08-19: qty 2

## 2017-08-19 MED ORDER — HYDROMORPHONE HCL 1 MG/ML IJ SOLN
1.0000 mg | Freq: Once | INTRAMUSCULAR | Status: AC | PRN
  Administered 2017-08-19: 1 mg via INTRAVENOUS
  Filled 2017-08-19: qty 1

## 2017-08-19 MED ORDER — OXYCODONE-ACETAMINOPHEN 5-325 MG PO TABS: 1.0000 | ORAL_TABLET | Freq: Three times a day (TID) | ORAL | 0 refills | Status: DC | PRN

## 2017-08-19 MED ORDER — ONDANSETRON HCL 4 MG PO TABS: 4.0000 mg | ORAL_TABLET | Freq: Once | ORAL | Status: DC

## 2017-08-19 MED ORDER — SODIUM CHLORIDE 0.9 % IV BOLUS
1000.0000 mL | Freq: Once | INTRAVENOUS | Status: AC
  Administered 2017-08-19: 1000 mL via INTRAVENOUS

## 2017-08-19 NOTE — ED Provider Notes (Signed)
[facility]-EMERGENCY DEPT Provider Note   CSN: [id number] Arrival date & time: 08/19/17  1244     History   Chief Complaint Chief Complaint  Patient presents with  . Abdominal Pain    HPI Isaac Dubie III is a 22 y.o. male.  HPI  Patient is a 22 year old male with history of ADHD, GERD, HSP, ankylosing spondylitis who presents the emergency department complaining of left lower quadrant abdominal pain that has been ongoing for the last 2 weeks but seem to be worsened over the last 3 to 4 days.  Pain is worse in the left lower quadrant.  Pain is constant and severe in nature.  He has tried Tylenol and Motrin at home with no significant relief.  States that he has a history of chronic abdominal pain related to his HSP which she has had very thorough work-ups for it and has been seen at [facility] for.  He states that today his pain feels consistent with his prior chronic abdominal pain.  He denies any fevers.  He feels nauseated, but denies any vomiting, diarrhea, urinary symptoms.  No flank pain.  No genitourinary symptoms. No chest pain. States pain is taking his breath away.   On review of prior records patient was seen in the emergency department about 1 month ago for similar symptoms.  He was treated with Dilaudid and receive IV fluids and had significant improvement of his symptoms.    Also reviewed records from his appointment with gastroenterology at [facility] in [place] on 07/16/2017.  There is no outlined patient's very detailed work-up including 2 EGDs and colonoscopies.  He has also had CT abdomen pelvis enterography with and without IV contrast.  Patient has also been evaluated at [facility] and [facility] for his symptoms.  He is also seen by rheumatology for his ankylosing spondylitis and HSP.  Past Medical History:  Diagnosis Date  . Acne   . ADHD   . GERD (gastroesophageal reflux disease)   . HSP (Henoch Schonlein purpura)  (HCC)     There are no active problems to display for this patient.   History reviewed. No pertinent surgical history.      Home Medications    Prior to Admission medications   Medication Sig Start Date End Date Taking? Authorizing Provider  acetaminophen (TYLENOL) 325 MG tablet Take 650 mg by mouth every 6 (six) hours as needed for mild pain.   Yes [provider]  ALPRAZolam Prudy Feeler) 1 MG tablet Take 1 mg by mouth daily as needed for anxiety. 06/22/17  Yes [provider]  Calcium Citrate-Vitamin D 315-250 MG-UNIT TABS Take 1 tablet by mouth daily. 12/19/14  Yes [provider]  dexmethylphenidate (FOCALIN XR) 10 MG 24 hr capsule Take 10 mg by mouth daily.  04/03/16  Yes [provider]  DULoxetine (CYMBALTA) 30 MG capsule Take 30 mg by mouth daily. 06/22/17  Yes [provider]  gabapentin (NEURONTIN) 300 MG capsule Take 300 mg by mouth 3 (three) times daily as needed for pain. 07/28/17  Yes [provider]  HUMIRA PEN 40 MG/0.4ML PNKT Inject 40 mg every 14 (fourteen) days into the skin. 12/05/16  Yes [provider]  linaclotide (LINZESS) 72 MCG capsule Take 72 mcg by mouth daily.  03/13/17  Yes [provider]  Multiple Vitamins-Minerals (MULTIVITAMIN ADULT) TABS Take 1 tablet by mouth every evening.   Yes [provider]  OVER THE COUNTER MEDICATION Take 1 drop by  mouth daily. CBD Oil   Yes [provider]  citalopram (CELEXA) 20 MG tablet Take 20 mg by mouth daily. 06/05/17   [provider]  ondansetron (ZOFRAN ODT) 4 MG disintegrating tablet Take 1 tablet (4 mg total) by mouth every 8 (eight) hours as needed for nausea or vomiting. 06/13/17   Donnetta Hutchingook, Brian, MD  ondansetron (ZOFRAN) 4 MG tablet Take 1 tablet (4 mg total) by mouth every 8 (eight) hours as needed for nausea or vomiting. 08/19/17   Vishruth Seoane S, PA-C  oxyCODONE-acetaminophen (PERCOCET/ROXICET) 5-325 MG tablet Take 1 tablet by  mouth every 8 (eight) hours as needed for severe pain. 08/19/17   Mahreen Schewe S, PA-C  predniSONE (DELTASONE) 50 MG tablet Take 1 tablet (50 mg total) by mouth daily. Patient not taking: Reported on 07/06/2017 05/28/17   Lawyer, Cristal Deerhristopher, PA-C  promethazine (PHENERGAN) 25 MG tablet Take 1 tablet (25 mg total) by mouth every 6 (six) hours as needed for nausea or vomiting. Patient not taking: Reported on 08/19/2017 05/28/17   Charlestine NightLawyer, Christopher, PA-C  sulfaSALAzine (AZULFIDINE) 500 MG EC tablet Take 1,000 mg by mouth daily.  05/27/17   [provider]    Family History Family History  Problem Relation Age of Onset  . Lupus Mother   . Hypertension Father     Social History Social History   Tobacco Use  . Smoking status: Never Smoker  . Smokeless tobacco: Never Used  Substance Use Topics  . Alcohol use: Yes    Comment: rarely  . Drug use: No     Allergies   Morphine; Morphine and related; Penicillins; and Tramadol   Review of Systems Review of Systems  Constitutional: Negative for chills and fever.  HENT: Negative for ear pain and sore throat.   Eyes: Negative for pain and visual disturbance.  Respiratory:       Pain takes breath away  Cardiovascular: Negative for chest pain.  Gastrointestinal: Positive for abdominal pain and nausea. Negative for blood in stool, constipation, diarrhea and vomiting.  Genitourinary: Negative for decreased urine volume, dysuria, flank pain, frequency, hematuria, scrotal swelling, testicular pain and urgency.  Musculoskeletal: Negative for back pain and neck pain.  Skin: Negative for color change and rash.  Neurological: Negative for headaches.  All other systems reviewed and are negative.  Physical Exam Updated Vital Signs BP 134/89 (BP Location: Right Arm)   Pulse 79   Temp 98.1 F (36.7 C) (Oral)   Resp 14   Ht 6\' 1"  (1.854 m)   Wt 99.8 kg (220 lb)   SpO2 100%   BMI 29.03 kg/m   Physical Exam  Constitutional: He appears  well-developed and well-nourished.  Non-toxic appearance. He appears distressed.  HENT:  Head: Normocephalic and atraumatic.  Mouth/Throat: Oropharynx is clear and moist.  Eyes: Pupils are equal, round, and reactive to light. Conjunctivae and EOM are normal.  Neck: Neck supple.  Cardiovascular: Normal rate, regular rhythm and normal heart sounds.  No murmur heard. Pulmonary/Chest: Effort normal and breath sounds normal. No stridor. No respiratory distress. He has no wheezes. He has no rales.  Abdominal: Soft. Bowel sounds are normal.  Diffuse TTP, worse in the LLQ with +guarding. No rigidity. No CVA TTP.  Musculoskeletal: He exhibits no edema.  Neurological: He is alert.  Skin: Skin is warm and dry. Capillary refill takes less than 2 seconds.  Psychiatric: He has a normal mood and affect.  Nursing note and vitals reviewed.  ED Treatments / Results  Labs (all labs ordered are listed, but only abnormal results are displayed) Labs Reviewed  COMPREHENSIVE METABOLIC PANEL - Abnormal; Notable for the following components:      Result Value   Potassium 3.2 (*)    All other components within normal limits  CBC - Abnormal; Notable for the following components:   MCHC 36.6 (*)    All other components within normal limits  LIPASE, BLOOD  URINALYSIS, ROUTINE W REFLEX MICROSCOPIC    EKG None  Radiology No results found.  Procedures Procedures (including critical care time)  Medications Ordered in ED Medications  sodium chloride 0.9 % bolus 1,000 mL (0 mLs Intravenous Stopped 08/19/17 1519)  HYDROmorphone (DILAUDID) injection 1 mg (1 mg Intravenous Given 08/19/17 1404)  HYDROmorphone (DILAUDID) injection 1 mg (1 mg Intravenous Given 08/19/17 1520)  sodium chloride 0.9 % bolus 1,000 mL (0 mLs Intravenous Stopped 08/19/17 1715)  potassium chloride SA (K-DUR,KLOR-CON) CR tablet 40 mEq (40 mEq Oral Given 08/19/17 1636)  HYDROmorphone (DILAUDID) injection 1 mg (1 mg Intravenous Given 08/19/17  1701)     Initial Impression / Assessment and Plan / ED Course  I have reviewed the triage vital signs and the nursing notes.  Pertinent labs & imaging results that were available during my care of the patient were reviewed by me and considered in my medical decision making (see chart for details).   4:35 PM reevaluated patient.  He is sitting up in the chair in no acute distress.  He states that he feels greatly improved after receiving pain medication.  He is still having some lower abdominal pain and is resting requesting 1 more dose of pain medications.   Final Clinical Impressions(s) / ED Diagnoses   Final diagnoses:  Chronic abdominal pain   Patient with a history of chronic lower abdominal pain and HSP presenting to the ER today complaining of acute worsening of his chronic abdominal pain.  Initially he was mildly tachycardic with otherwise stable vital signs.  Is afebrile.  He has no other GI symptoms.  No urinary symptoms.  His abdominal exam reveals tenderness to the left lower quadrant and right lower quadrant of the abdomen with voluntary guarding.  Patient was given 2 L of normal saline as well as multiple doses of pain medication.  His labs showed no leukocytosis or anemia.  He had mild hypokalemia which was supplemented in the ED.  Otherwise had normal creatinine and liver function.  Normal lipase.  UA without evidence of UTI.  Patient much improved after pain medications and fluids in the ED.  Abdomen is soft on reevaluation.  He is nontoxic and nonseptic appearing.  Suspect his symptoms are due to his chronic abdominal pain.  Reviewed his chart extensively and his symptoms today seem consistent with his chronic abdominal pain. advised that he follow-up with his gastroenterologist about his symptoms.  Advised strict return precautions for any new or worsening symptoms.  Patient voiced understanding of plan reasons to return immediately to the ED.  All questions answered.  ED  Discharge Orders        Ordered    oxyCODONE-acetaminophen (PERCOCET/ROXICET) 5-325 MG tablet  Every 8 hours PRN,   Status:  Discontinued     08/19/17 1650    ondansetron (ZOFRAN) 4 MG tablet  Every 8 hours PRN,   Status:  Discontinued     08/19/17 1650    oxyCODONE-acetaminophen (PERCOCET/ROXICET) 5-325 MG tablet  Every 8 hours PRN,   Status:  Discontinued  08/19/17 1841    ondansetron (ZOFRAN) 4 MG tablet  Every 8 hours PRN,   Status:  Discontinued     08/19/17 1841    ondansetron (ZOFRAN) 4 MG tablet  Every 8 hours PRN     08/19/17 1843    oxyCODONE-acetaminophen (PERCOCET/ROXICET) 5-325 MG tablet  Every 8 hours PRN     08/19/17 1843       Karrie Meres, PA-C 08/19/17 2224    Arby Barrette, MD 08/28/17 1702

## 2017-08-19 NOTE — Discharge Instructions (Addendum)
You were given a prescription for Zofran for nausea and Percocet for your abdominal pain.  Please do not drive, work, operate machinery if you are taking Percocet as it can make you very drowsy.  Please follow-up with your gastroenterologist in regards to your visit to the emergency department today.  Please follow up with your primary doctor within the next 5-7 days.  If you do not have a primary care provider, information for a healthcare clinic has been provided for you to make arrangements for follow up care. Please return to the ER sooner if you have any new or worsening symptoms, or if you have any of the following symptoms:  Abdominal pain that does not go away.  You have a fever.  You keep throwing up (vomiting).  The pain is felt only in portions of the abdomen. Pain in the right side could possibly be appendicitis. In an adult, pain in the left lower portion of the abdomen could be colitis or diverticulitis.  You pass bloody or black tarry stools.  There is bright red blood in the stool.  The constipation stays for more than 4 days.  There is belly (abdominal) or rectal pain.  You do not seem to be getting better.  You have any questions or concerns.

## 2017-08-19 NOTE — ED Triage Notes (Signed)
Pt arrives c/o lower abd pain, greater on the left side than right x 4-5 days. 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 Tried tylenol/ibuprofen with no relief. Denies N/V/D.

## 2017-11-03 ENCOUNTER — Encounter (HOSPITAL_COMMUNITY): Payer: Self-pay | Admitting: Emergency Medicine

## 2017-11-03 ENCOUNTER — Emergency Department (HOSPITAL_COMMUNITY)
Admission: EM | Admit: 2017-11-03 | Discharge: 2017-11-03 | Disposition: A | Discharge status: Home / Self Care | Payer: BLUE CROSS/BLUE SHIELD | Attending: Emergency Medicine | Admitting: Emergency Medicine

## 2017-11-03 DIAGNOSIS — R109 Unspecified abdominal pain: Secondary | ICD-10-CM | POA: Diagnosis present

## 2017-11-03 DIAGNOSIS — F909 Attention-deficit hyperactivity disorder, unspecified type: Secondary | ICD-10-CM | POA: Insufficient documentation

## 2017-11-03 DIAGNOSIS — R1013 Epigastric pain: Secondary | ICD-10-CM | POA: Insufficient documentation

## 2017-11-03 DIAGNOSIS — Z79899 Other long term (current) drug therapy: Secondary | ICD-10-CM | POA: Diagnosis not present

## 2017-11-03 LAB — CBC
HEMATOCRIT: 43.1 % (ref 39.0–52.0)
HEMOGLOBIN: 15.5 g/dL (ref 13.0–17.0)
MCH: 32.7 pg (ref 26.0–34.0)
MCHC: 36 g/dL (ref 30.0–36.0)
MCV: 90.9 fL (ref 78.0–100.0)
Platelets: 194 10*3/uL (ref 150–400)
RBC: 4.74 MIL/uL (ref 4.22–5.81)
RDW: 11.9 % (ref 11.5–15.5)
WBC: 6.8 10*3/uL (ref 4.0–10.5)

## 2017-11-03 LAB — COMPREHENSIVE METABOLIC PANEL
ALT: 22 U/L (ref 0–44)
ANION GAP: 9 (ref 5–15)
AST: 23 U/L (ref 15–41)
Albumin: 4.6 g/dL (ref 3.5–5.0)
Alkaline Phosphatase: 63 U/L (ref 38–126)
BILIRUBIN TOTAL: 1.2 mg/dL (ref 0.3–1.2)
BUN: 12 mg/dL (ref 6–20)
CHLORIDE: 105 mmol/L (ref 98–111)
CO2: 29 mmol/L (ref 22–32)
Calcium: 9.5 mg/dL (ref 8.9–10.3)
Creatinine, Ser: 1.16 mg/dL (ref 0.61–1.24)
GFR calc Af Amer: >60 mL/min (ref >60)
Glucose, Bld: 71 mg/dL (ref 70–99)
POTASSIUM: 4.1 mmol/L (ref 3.5–5.1)
Sodium: 143 mmol/L (ref 135–145)
TOTAL PROTEIN: 7.5 g/dL (ref 6.5–8.1)

## 2017-11-03 LAB — LIPASE, BLOOD: Lipase: 47 U/L (ref 11–51)

## 2017-11-03 MED ORDER — HYDROMORPHONE HCL 1 MG/ML IJ SOLN
1.0000 mg | Freq: Once | INTRAMUSCULAR | Status: AC
  Administered 2017-11-03: 1 mg via INTRAVENOUS
  Filled 2017-11-03: qty 1

## 2017-11-03 MED ORDER — OXYCODONE-ACETAMINOPHEN 5-325 MG PO TABS
1.0000 | ORAL_TABLET | Freq: Once | ORAL | Status: AC
  Administered 2017-11-03: 1 via ORAL
  Filled 2017-11-03: qty 1

## 2017-11-03 MED ORDER — SODIUM CHLORIDE 0.9 % IV BOLUS
1000.0000 mL | Freq: Once | INTRAVENOUS | Status: AC
  Administered 2017-11-03: 1000 mL via INTRAVENOUS

## 2017-11-03 MED ORDER — ONDANSETRON HCL 4 MG/2ML IJ SOLN
4.0000 mg | Freq: Once | INTRAMUSCULAR | Status: AC
  Administered 2017-11-03: 4 mg via INTRAVENOUS
  Filled 2017-11-03: qty 2

## 2017-11-03 MED ORDER — ONDANSETRON HCL 4 MG/2ML IJ SOLN
4.0000 mg | Freq: Once | INTRAMUSCULAR | Status: AC
Start: 2017-11-03 — End: 2017-11-03
  Administered 2017-11-03: 4 mg via INTRAVENOUS
  Filled 2017-11-03: qty 2

## 2017-11-03 NOTE — ED Triage Notes (Signed)
Pt reports abd pains with nausea starting last night. Denies vomiting, bowel or urinary problems.

## 2017-11-03 NOTE — ED Notes (Signed)
Second time informing pt of need for urine.  Pt stated that he cannot but is mindful of the need for the specimen.

## 2017-11-03 NOTE — Discharge Instructions (Addendum)
Follow up with your md if needed °

## 2017-11-03 NOTE — ED Provider Notes (Signed)
[facility]-EMERGENCY DEPT Provider Note   CSN: [id number] Arrival date & time: 11/03/17  1209     History   Chief Complaint Chief Complaint  Patient presents with  . Abdominal Pain  . Nausea    HPI Gregory Conway is a 22 y.o. male.  HPI Patient is a 22 year old male who I seen before as a history of HSP and recurrent intermittent abdominal pain.  His last flare began last night.  He has had nausea without vomiting.  Reports crampy type mid abdominal pain.  Decreased oral intake today.  I last saw him in November 2018.  He has had several ER visits since then.  He follows closely with his primary care physician and this is usually managed as an outpatient however his symptoms became too severe today.  He is a current Consulting civil engineer at the [employer].  He is requesting assistance with IV fluids and pain medicine to help him through this "flare".   Past Medical History:  Diagnosis Date  . Acne   . ADHD   . GERD (gastroesophageal reflux disease)   . HSP (Henoch Schonlein purpura) (HCC)     There are no active problems to display for this patient.   History reviewed. No pertinent surgical history.      Home Medications    Prior to Admission medications   Medication Sig Start Date End Date Taking? Authorizing Provider  acetaminophen (TYLENOL) 325 MG tablet Take 650 mg by mouth every 6 (six) hours as needed for mild pain.   Yes [provider]  ALPRAZolam Prudy Feeler) 1 MG tablet Take 1 mg by mouth daily as needed for anxiety. 06/22/17  Yes [provider]  dexmethylphenidate (FOCALIN XR) 10 MG 24 hr capsule Take 10 mg by mouth daily.  04/03/16  Yes [provider]  DULoxetine (CYMBALTA) 30 MG capsule Take 90 mg by mouth daily.  06/22/17  Yes [provider]  gabapentin (NEURONTIN) 300 MG capsule Take 300 mg by mouth 3 (three) times daily as needed for pain. 07/28/17  Yes [provider]  HUMIRA PEN 40 MG/0.4ML PNKT  Inject 40 mg every 14 (fourteen) days into the skin. 12/05/16  Yes [provider]  linaclotide (LINZESS) 72 MCG capsule Take 72 mcg by mouth daily.  03/13/17  Yes [provider]  Multiple Vitamins-Minerals (MULTIVITAMIN ADULT) TABS Take 1 tablet by mouth every evening.   Yes [provider]  ondansetron (ZOFRAN ODT) 4 MG disintegrating tablet Take 1 tablet (4 mg total) by mouth every 8 (eight) hours as needed for nausea or vomiting. Patient not taking: Reported on 11/03/2017 06/13/17   Donnetta Hutching, MD  ondansetron (ZOFRAN) 4 MG tablet Take 1 tablet (4 mg total) by mouth every 8 (eight) hours as needed for nausea or vomiting. Patient not taking: Reported on 11/03/2017 08/19/17   Couture, Cortni S, PA-C  oxyCODONE-acetaminophen (PERCOCET/ROXICET) 5-325 MG tablet Take 1 tablet by mouth every 8 (eight) hours as needed for severe pain. Patient not taking: Reported on 11/03/2017 08/19/17   Couture, Cortni S, PA-C  predniSONE (DELTASONE) 50 MG tablet Take 1 tablet (50 mg total) by mouth daily. Patient not taking: Reported on 11/03/2017 05/28/17   Lawyer, Cristal Deer, PA-C  promethazine (PHENERGAN) 25 MG tablet Take 1 tablet (25 mg total) by mouth every 6 (six) hours as needed for nausea or vomiting. Patient not taking: Reported on 11/03/2017 05/28/17   Charlestine Night, PA-C    Family History Family History  Problem Relation  Age of Onset  . Lupus Mother   . Hypertension Father     Social History Social History   Tobacco Use  . Smoking status: Never Smoker  . Smokeless tobacco: Never Used  Substance Use Topics  . Alcohol use: Yes    Comment: rarely  . Drug use: No     Allergies   Morphine and related; Penicillins; and Tramadol   Review of Systems Review of Systems  All other systems reviewed and are negative.    Physical Exam Updated Vital Signs BP 130/82 (BP Location: Left Arm)   Pulse 99   Temp 98.6 F (37 C) (Oral)   Resp 18   SpO2 98%   Physical  Exam  Constitutional: He is oriented to person, place, and time. He appears well-developed and well-nourished.  HENT:  Head: Normocephalic and atraumatic.  Eyes: EOM are normal.  Neck: Normal range of motion.  Cardiovascular: Normal rate, regular rhythm, normal heart sounds and intact distal pulses.  Pulmonary/Chest: Effort normal and breath sounds normal. No respiratory distress.  Abdominal: Soft. He exhibits no distension. There is no tenderness.  Musculoskeletal: Normal range of motion.  Neurological: He is alert and oriented to person, place, and time.  Skin: Skin is warm and dry.  Psychiatric: He has a normal mood and affect. Judgment normal.  Nursing note and vitals reviewed.    ED Treatments / Results  Labs (all labs ordered are listed, but only abnormal results are displayed) Labs Reviewed  LIPASE, BLOOD  COMPREHENSIVE METABOLIC PANEL  CBC  URINALYSIS, ROUTINE W REFLEX MICROSCOPIC    EKG None  Radiology No results found.  Procedures Procedures (including critical care time)  Medications Ordered in ED Medications  HYDROmorphone (DILAUDID) injection 1 mg (has no administration in time range)  oxyCODONE-acetaminophen (PERCOCET/ROXICET) 5-325 MG per tablet 1 tablet (has no administration in time range)  HYDROmorphone (DILAUDID) injection 1 mg (1 mg Intravenous Given 11/03/17 1314)  sodium chloride 0.9 % bolus 1,000 mL (0 mLs Intravenous Stopped 11/03/17 1415)  ondansetron (ZOFRAN) injection 4 mg (4 mg Intravenous Given 11/03/17 1314)  HYDROmorphone (DILAUDID) injection 1 mg (1 mg Intravenous Given 11/03/17 1518)     Initial Impression / Assessment and Plan / ED Course  I have reviewed the triage vital signs and the nursing notes.  Pertinent labs & imaging results that were available during my care of the patient were reviewed by me and considered in my medical decision making (see chart for details).    Recurrent abdominal pain.  Similar to prior episodes.  Will  attempt symptom control with IV opioid pain medication and nausea medication.  IV fluids given.  Patient will remain in the emergency department until his symptoms have improved.  He will likely be discharged home soon to close follow-up with his primary care physician.  4:34 PM Care to Dr Estell Harpin to follow up on pain control  Final Clinical Impressions(s) / ED Diagnoses   Final diagnoses:  None    ED Discharge Orders    None       Azalia Bilis, MD 11/03/17 [phone]

## 2017-11-03 NOTE — ED Notes (Signed)
Informed Dr. Patria Mane that patient wanted additional pain medication and concerned about another liter of fluids.

## 2017-12-11 ENCOUNTER — Emergency Department (HOSPITAL_COMMUNITY)
Admission: EM | Admit: 2017-12-11 | Discharge: 2017-12-11 | Disposition: A | Discharge status: Home / Self Care | Payer: BLUE CROSS/BLUE SHIELD | Attending: Emergency Medicine | Admitting: Emergency Medicine

## 2017-12-11 ENCOUNTER — Other Ambulatory Visit: Payer: Self-pay

## 2017-12-11 DIAGNOSIS — G8929 Other chronic pain: Secondary | ICD-10-CM

## 2017-12-11 DIAGNOSIS — D690 Allergic purpura: Secondary | ICD-10-CM | POA: Diagnosis not present

## 2017-12-11 DIAGNOSIS — Z79899 Other long term (current) drug therapy: Secondary | ICD-10-CM | POA: Insufficient documentation

## 2017-12-11 DIAGNOSIS — R109 Unspecified abdominal pain: Secondary | ICD-10-CM | POA: Diagnosis not present

## 2017-12-11 LAB — COMPREHENSIVE METABOLIC PANEL
ALT: 22 U/L (ref 0–44)
AST: 21 U/L (ref 15–41)
Albumin: 4.6 g/dL (ref 3.5–5.0)
Alkaline Phosphatase: 59 U/L (ref 38–126)
Anion gap: 10 (ref 5–15)
BUN: 16 mg/dL (ref 6–20)
CO2: 30 mmol/L (ref 22–32)
Calcium: 9.7 mg/dL (ref 8.9–10.3)
Chloride: 103 mmol/L (ref 98–111)
Creatinine, Ser: 1.36 mg/dL — ABNORMAL HIGH (ref 0.61–1.24)
GFR calc non Af Amer: >60 mL/min (ref >60)
Glucose, Bld: 106 mg/dL — ABNORMAL HIGH (ref 70–99)
POTASSIUM: 3.4 mmol/L — AB (ref 3.5–5.1)
Sodium: 143 mmol/L (ref 135–145)
TOTAL PROTEIN: 7.4 g/dL (ref 6.5–8.1)
Total Bilirubin: 1.4 mg/dL — ABNORMAL HIGH (ref 0.3–1.2)

## 2017-12-11 LAB — CBC
HCT: 43.3 % (ref 39.0–52.0)
Hemoglobin: 15.3 g/dL (ref 13.0–17.0)
MCH: 32.2 pg (ref 26.0–34.0)
MCHC: 35.3 g/dL (ref 30.0–36.0)
MCV: 91.2 fL (ref 80.0–100.0)
NRBC: 0 % (ref 0.0–0.2)
Platelets: 180 10*3/uL (ref 150–400)
RBC: 4.75 MIL/uL (ref 4.22–5.81)
RDW: 12 % (ref 11.5–15.5)
WBC: 6.4 10*3/uL (ref 4.0–10.5)

## 2017-12-11 LAB — LIPASE, BLOOD: Lipase: 55 U/L — ABNORMAL HIGH (ref 11–51)

## 2017-12-11 MED ORDER — ONDANSETRON HCL 4 MG/2ML IJ SOLN
4.0000 mg | Freq: Once | INTRAMUSCULAR | Status: AC
  Administered 2017-12-11: 4 mg via INTRAVENOUS
  Filled 2017-12-11: qty 2

## 2017-12-11 MED ORDER — HYDROMORPHONE HCL 1 MG/ML IJ SOLN
1.0000 mg | INTRAMUSCULAR | Status: AC | PRN
  Administered 2017-12-11 (×2): 1 mg via INTRAVENOUS
  Filled 2017-12-11 (×2): qty 1

## 2017-12-11 MED ORDER — SODIUM CHLORIDE 0.9 % IV SOLN
1000.0000 mL | INTRAVENOUS | Status: DC
  Administered 2017-12-11: 1000 mL via INTRAVENOUS

## 2017-12-11 MED ORDER — SODIUM CHLORIDE 0.9 % IV BOLUS (SEPSIS)
1000.0000 mL | Freq: Once | INTRAVENOUS | Status: AC
  Administered 2017-12-11: 1000 mL via INTRAVENOUS

## 2017-12-11 MED ORDER — HYDROMORPHONE HCL 1 MG/ML IJ SOLN
1.0000 mg | Freq: Once | INTRAMUSCULAR | Status: AC
  Administered 2017-12-11: 1 mg via INTRAVENOUS
  Filled 2017-12-11: qty 1

## 2017-12-11 MED ORDER — HYDROMORPHONE HCL 1 MG/ML IJ SOLN
1.0000 mg | INTRAMUSCULAR | Status: DC | PRN
  Administered 2017-12-11: 1 mg via INTRAVENOUS
  Filled 2017-12-11: qty 1

## 2017-12-11 MED ORDER — OXYCODONE-ACETAMINOPHEN 5-325 MG PO TABS
1.0000 | ORAL_TABLET | Freq: Once | ORAL | Status: AC
  Administered 2017-12-11: 1 via ORAL
  Filled 2017-12-11: qty 1

## 2017-12-11 MED ORDER — ONDANSETRON 8 MG PO TBDP: 8.0000 mg | ORAL_TABLET | Freq: Three times a day (TID) | ORAL | 0 refills | Status: DC | PRN

## 2017-12-11 NOTE — ED Triage Notes (Signed)
Patient arrives with c/o LLQ. Patient sees GI doctor and dx with HSP. +Nausea from pain. -vomiting, -diarrhea. Denies urinary complaints. Patient states pain feels like recurrent pain from HSP.

## 2017-12-11 NOTE — ED Provider Notes (Signed)
[facility]-EMERGENCY DEPT Provider Note   CSN: [id number] Arrival date & time: 12/11/17  4098     History   Chief Complaint Chief Complaint  Patient presents with  . Abdominal Pain    HPI Gregory Conway is a 22 y.o. male.  HPI 22 year old male with a history of HSP and a history of recurrent chronic abdominal pain.  Patient has had extensive outpatient work-up by multiple specialists and GI teams without clear etiology found.  He usually is well controlled at home and occasionally uses as needed oxycodone as prescribed by his primary care physician.  This is not helped his pain today.  He presents with nausea.  Denies vomiting and diarrhea.  No fevers or chills.  Pain is severe in severity.  This feels like his prior abdominal pain.  I have seen him previously for the same complaints.   Past Medical History:  Diagnosis Date  . Acne   . ADHD   . GERD (gastroesophageal reflux disease)   . HSP (Henoch Schonlein purpura) (HCC)     There are no active problems to display for this patient.   No past surgical history on file.      Home Medications    Prior to Admission medications   Medication Sig Start Date End Date Taking? Authorizing Provider  acetaminophen (TYLENOL) 325 MG tablet Take 650 mg by mouth every 6 (six) hours as needed for mild pain.   Yes [provider]  ALPRAZolam Prudy Feeler) 1 MG tablet Take 1 mg by mouth daily as needed for anxiety. 06/22/17  Yes [provider]  dexmethylphenidate (FOCALIN XR) 10 MG 24 hr capsule Take 10 mg by mouth daily.  04/03/16  Yes [provider]  DULoxetine (CYMBALTA) 30 MG capsule Take 90 mg by mouth daily.  06/22/17  Yes [provider]  gabapentin (NEURONTIN) 300 MG capsule Take 300 mg by mouth 3 (three) times daily as needed for pain. 07/28/17  Yes [provider]  HUMIRA PEN 40 MG/0.4ML PNKT Inject 40 mg every 14 (fourteen) days into the skin. 12/05/16  Yes [provider]  linaclotide (LINZESS) 72 MCG capsule Take 72 mcg by mouth daily.  03/13/17  Yes [provider]  Multiple Vitamins-Minerals (MULTIVITAMIN ADULT) TABS Take 1 tablet by mouth every evening.   Yes [provider]  ondansetron (ZOFRAN ODT) 8 MG disintegrating tablet Take 1 tablet (8 mg total) by mouth every 8 (eight) hours as needed for nausea or vomiting. 12/11/17   Azalia Bilis, MD  promethazine (PHENERGAN) 25 MG tablet Take 1 tablet (25 mg total) by mouth every 6 (six) hours as needed for nausea or vomiting. Patient not taking: Reported on 11/03/2017 05/28/17   Charlestine Night, PA-C    Family History Family History  Problem Relation Age of Onset  . Lupus Mother   . Hypertension Father     Social History Social History   Tobacco Use  . Smoking status: Never Smoker  . Smokeless tobacco: Never Used  Substance Use Topics  . Alcohol use: Yes    Comment: rarely  . Drug use: No     Allergies   Morphine and related; Penicillins; and Tramadol   Review of Systems Review of Systems  All other systems reviewed and are negative.    Physical Exam Updated Vital Signs BP 125/87   Pulse 94   Temp (!) 97.5 F (36.4 C) (Oral)   Resp 16   Ht 6\' 1"  (1.854 m)  Wt 102.1 kg   SpO2 94%   BMI 29.69 kg/m   Physical Exam  Constitutional: He is oriented to person, place, and time. He appears well-developed and well-nourished.  HENT:  Head: Normocephalic and atraumatic.  Eyes: EOM are normal.  Neck: Normal range of motion.  Cardiovascular: Normal rate, regular rhythm, normal heart sounds and intact distal pulses.  Pulmonary/Chest: Effort normal and breath sounds normal. No respiratory distress.  Abdominal: Soft. He exhibits no distension. There is no tenderness.  Musculoskeletal: Normal range of motion.  Neurological: He is alert and oriented to person, place, and time.  Skin: Skin is warm and dry.  Psychiatric: He has a normal mood and affect.  Judgment normal.  Nursing note and vitals reviewed.    ED Treatments / Results  Labs (all labs ordered are listed, but only abnormal results are displayed) Labs Reviewed  COMPREHENSIVE METABOLIC PANEL - Abnormal; Notable for the following components:      Result Value   Potassium 3.4 (*)    Glucose, Bld 106 (*)    Creatinine, Ser 1.36 (*)    Total Bilirubin 1.4 (*)    All other components within normal limits  LIPASE, BLOOD - Abnormal; Notable for the following components:   Lipase 55 (*)    All other components within normal limits  CBC    EKG None  Radiology No results found.  Procedures Procedures (including critical care time)  Medications Ordered in ED Medications  sodium chloride 0.9 % bolus 1,000 mL (0 mLs Intravenous Stopped 12/11/17 1058)    Followed by  0.9 %  sodium chloride infusion (0 mLs Intravenous Stopped 12/11/17 1620)  HYDROmorphone (DILAUDID) injection 1 mg (1 mg Intravenous Given 12/11/17 1454)  ondansetron (ZOFRAN) injection 4 mg (4 mg Intravenous Given 12/11/17 0947)  HYDROmorphone (DILAUDID) injection 1 mg (1 mg Intravenous Given 12/11/17 0947)  HYDROmorphone (DILAUDID) injection 1 mg (1 mg Intravenous Given 12/11/17 1149)  HYDROmorphone (DILAUDID) injection 1 mg (1 mg Intravenous Given 12/11/17 1405)  HYDROmorphone (DILAUDID) injection 1 mg (1 mg Intravenous Given 12/11/17 1625)  oxyCODONE-acetaminophen (PERCOCET/ROXICET) 5-325 MG per tablet 1 tablet (1 tablet Oral Given 12/11/17 1625)     Initial Impression / Assessment and Plan / ED Course  I have reviewed the triage vital signs and the nursing notes.  Pertinent labs & imaging results that were available during my care of the patient were reviewed by me and considered in my medical decision making (see chart for details).    After treatment of his symptoms here in the emergency department he feels better and would like to go home.  Discharged home in good condition.  Primary care and  subspecialist follow-up.  He understands return to the ER for new or worsening symptoms  Final Clinical Impressions(s) / ED Diagnoses   Final diagnoses:  Chronic abdominal pain  HSP (Henoch Schonlein purpura) [facility])    ED Discharge Orders         Ordered    ondansetron (ZOFRAN ODT) 8 MG disintegrating tablet  Every 8 hours PRN     12/11/17 1609           Azalia Bilis, MD 12/11/17 1633

## 2017-12-16 ENCOUNTER — Other Ambulatory Visit: Payer: Self-pay

## 2017-12-16 ENCOUNTER — Encounter (HOSPITAL_COMMUNITY): Payer: Self-pay

## 2017-12-16 ENCOUNTER — Emergency Department (HOSPITAL_COMMUNITY)
Admission: EM | Admit: 2017-12-16 | Discharge: 2017-12-16 | Disposition: A | Discharge status: Home / Self Care | Payer: BLUE CROSS/BLUE SHIELD | Attending: Emergency Medicine | Admitting: Emergency Medicine

## 2017-12-16 DIAGNOSIS — D690 Allergic purpura: Secondary | ICD-10-CM | POA: Diagnosis not present

## 2017-12-16 DIAGNOSIS — R109 Unspecified abdominal pain: Secondary | ICD-10-CM | POA: Insufficient documentation

## 2017-12-16 DIAGNOSIS — G8929 Other chronic pain: Secondary | ICD-10-CM

## 2017-12-16 LAB — CBC WITH DIFFERENTIAL/PLATELET
ABS IMMATURE GRANULOCYTES: 0.02 10*3/uL (ref 0.00–0.07)
BASOS ABS: 0 10*3/uL (ref 0.0–0.1)
BASOS PCT: 1 %
Eosinophils Absolute: 0.1 10*3/uL (ref 0.0–0.5)
Eosinophils Relative: 2 %
HCT: 40.5 % (ref 39.0–52.0)
Hemoglobin: 14.4 g/dL (ref 13.0–17.0)
IMMATURE GRANULOCYTES: 0 %
Lymphocytes Relative: 28 %
Lymphs Abs: 2.2 10*3/uL (ref 0.7–4.0)
MCH: 32.5 pg (ref 26.0–34.0)
MCHC: 35.6 g/dL (ref 30.0–36.0)
MCV: 91.4 fL (ref 80.0–100.0)
MONO ABS: 0.6 10*3/uL (ref 0.1–1.0)
MONOS PCT: 7 %
Neutro Abs: 5.1 10*3/uL (ref 1.7–7.7)
Neutrophils Relative %: 62 %
PLATELETS: 192 10*3/uL (ref 150–400)
RBC: 4.43 MIL/uL (ref 4.22–5.81)
RDW: 12.1 % (ref 11.5–15.5)
WBC: 8.1 10*3/uL (ref 4.0–10.5)
nRBC: 0 % (ref 0.0–0.2)

## 2017-12-16 LAB — COMPREHENSIVE METABOLIC PANEL
ALBUMIN: 4.3 g/dL (ref 3.5–5.0)
ALK PHOS: 51 U/L (ref 38–126)
ALT: 18 U/L (ref 0–44)
AST: 17 U/L (ref 15–41)
Anion gap: 8 (ref 5–15)
BILIRUBIN TOTAL: 1 mg/dL (ref 0.3–1.2)
BUN: 23 mg/dL — ABNORMAL HIGH (ref 6–20)
CALCIUM: 8.7 mg/dL — AB (ref 8.9–10.3)
CO2: 26 mmol/L (ref 22–32)
Chloride: 105 mmol/L (ref 98–111)
Creatinine, Ser: 1.14 mg/dL (ref 0.61–1.24)
GFR calc Af Amer: >60 mL/min (ref >60)
GFR calc non Af Amer: >60 mL/min (ref >60)
GLUCOSE: 92 mg/dL (ref 70–99)
Potassium: 3.8 mmol/L (ref 3.5–5.1)
Sodium: 139 mmol/L (ref 135–145)
TOTAL PROTEIN: 7.5 g/dL (ref 6.5–8.1)

## 2017-12-16 LAB — LIPASE, BLOOD: LIPASE: 44 U/L (ref 11–51)

## 2017-12-16 MED ORDER — HYDROMORPHONE HCL 1 MG/ML IJ SOLN
1.0000 mg | Freq: Once | INTRAMUSCULAR | Status: AC
  Administered 2017-12-16: 1 mg via INTRAVENOUS
  Filled 2017-12-16: qty 1

## 2017-12-16 MED ORDER — HYDROMORPHONE HCL 1 MG/ML IJ SOLN
0.5000 mg | Freq: Once | INTRAMUSCULAR | Status: AC
  Administered 2017-12-16: 0.5 mg via INTRAVENOUS
  Filled 2017-12-16: qty 1

## 2017-12-16 MED ORDER — SODIUM CHLORIDE 0.9 % IV BOLUS
1000.0000 mL | Freq: Once | INTRAVENOUS | Status: AC
  Administered 2017-12-16: 1000 mL via INTRAVENOUS

## 2017-12-16 MED ORDER — HALOPERIDOL LACTATE 5 MG/ML IJ SOLN
2.0000 mg | Freq: Once | INTRAMUSCULAR | Status: DC
  Filled 2017-12-16: qty 1

## 2017-12-16 MED ORDER — OXYCODONE-ACETAMINOPHEN 5-325 MG PO TABS
1.0000 | ORAL_TABLET | Freq: Once | ORAL | Status: AC
  Administered 2017-12-16: 1 via ORAL
  Filled 2017-12-16: qty 1

## 2017-12-16 MED ORDER — HYDROMORPHONE HCL 1 MG/ML IJ SOLN
0.5000 mg | Freq: Once | INTRAMUSCULAR | Status: DC
  Filled 2017-12-16: qty 1

## 2017-12-16 MED ORDER — ONDANSETRON HCL 4 MG/2ML IJ SOLN
4.0000 mg | Freq: Once | INTRAMUSCULAR | Status: AC
  Administered 2017-12-16: 4 mg via INTRAVENOUS
  Filled 2017-12-16: qty 2

## 2017-12-16 MED ORDER — HYDROMORPHONE HCL 1 MG/ML IJ SOLN: 0.5000 mg | Freq: Once | INTRAMUSCULAR | Status: DC

## 2017-12-16 NOTE — ED Notes (Signed)
Patient refused Haldol medication. Patient stated they would like to speak with PA.

## 2017-12-16 NOTE — ED Notes (Signed)
Patient was asked to provide urine sample. Patient stated they are not able to provide urine at this time due to pain.

## 2017-12-16 NOTE — ED Provider Notes (Signed)
[facility]   CSN: [id number] Arrival date & time: 12/16/17  0550     History   Chief Complaint No chief complaint on file.   HPI Gregory Conway is a 22 y.o. male with PMH/o ADHD, GERD, HSP and chronic abdominal pain who presents for evaluation of generalized abdominal pain that has been ongoing for the last several days. He was seen in the ED on 12/11/17 for evaluation of similar symptoms and at that time felt better after analgesics in the ED. He was able to be discharged home where he states his pain was better for a few hours before returning. He has been taking Percocet at home which he states has not really been helping to control his pain. His pain is generalized but slightly worse in the LLQ, which he states is typical of his flares. He has also had some episodes of NBNB vomiting. He reports difficulty tolerating PO at home secondary to pain. His last BM was this AM and was normal and without any blood. Patient denies any fevers, CP, SOB, urinary complaints, rash.   The history is provided by the patient.    Past Medical History:  Diagnosis Date  . Acne   . ADHD   . GERD (gastroesophageal reflux disease)   . HSP (Henoch Schonlein purpura) (HCC)     There are no active problems to display for this patient.   History reviewed. No pertinent surgical history.      Home Medications    Prior to Admission medications   Medication Sig Start Date End Date Taking? Authorizing Provider  acetaminophen (TYLENOL) 325 MG tablet Take 650 mg by mouth every 6 (six) hours as needed for mild pain.   Yes [provider]  ALPRAZolam Prudy Feeler) 1 MG tablet Take 1 mg by mouth daily as needed for anxiety. 06/22/17  Yes [provider]  dexmethylphenidate (FOCALIN XR) 10 MG 24 hr capsule Take 10 mg by mouth daily.  04/03/16  Yes [provider]  DULoxetine (CYMBALTA) 30 MG capsule Take 90 mg by mouth daily.  06/22/17   Yes [provider]  gabapentin (NEURONTIN) 300 MG capsule Take 300 mg by mouth 3 (three) times daily as needed for pain. 07/28/17  Yes [provider]  HUMIRA PEN 40 MG/0.4ML PNKT Inject 40 mg into the skin once a week.  12/05/16  Yes [provider]  linaclotide (LINZESS) 72 MCG capsule Take 72 mcg by mouth daily as needed (diarrhea).  03/13/17  Yes [provider]  Multiple Vitamins-Minerals (MULTIVITAMIN ADULT) TABS Take 1 tablet by mouth 3 (three) times a week.    Yes [provider]  ondansetron (ZOFRAN ODT) 8 MG disintegrating tablet Take 1 tablet (8 mg total) by mouth every 8 (eight) hours as needed for nausea or vomiting. 12/11/17  Yes Azalia Bilis, MD  promethazine (PHENERGAN) 25 MG tablet Take 1 tablet (25 mg total) by mouth every 6 (six) hours as needed for nausea or vomiting. 05/28/17  Yes Lawyer, Cristal Deer, PA-C    Family History Family History  Problem Relation Age of Onset  . Lupus Mother   . Hypertension Father     Social History Social History   Tobacco Use  . Smoking status: Never Smoker  . Smokeless tobacco: Never Used  Substance Use Topics  . Alcohol use: Yes    Comment: rarely  . Drug use: No     Allergies   Morphine and related; Penicillins; and Tramadol  Review of Systems Review of Systems  Constitutional: Negative for fever.  Respiratory: Negative for cough and shortness of breath.   Cardiovascular: Negative for chest pain.  Gastrointestinal: Positive for abdominal pain, nausea and vomiting. Negative for blood in stool and diarrhea.  Genitourinary: Negative for dysuria and hematuria.  Skin: Negative for rash.  Neurological: Negative for headaches.     Physical Exam Updated Vital Signs BP (!) 129/94 (BP Location: Left Arm)   Pulse 75   Temp 97.9 F (36.6 C) (Oral)   Resp 18   Ht 6\' 1"  (1.854 m)   Wt 102.1 kg   SpO2 92%   BMI 29.69 kg/m   Physical Exam  Constitutional: He is oriented to  person, place, and time. He appears well-developed and well-nourished.  Appears uncomfortable but no acute distress   HENT:  Head: Normocephalic and atraumatic.  Mouth/Throat: Oropharynx is clear and moist and mucous membranes are normal.  Eyes: Pupils are equal, round, and reactive to light. Conjunctivae, EOM and lids are normal.  Neck: Full passive range of motion without pain.  Cardiovascular: Normal rate, regular rhythm, normal heart sounds and normal pulses. Exam reveals no gallop and no friction rub.  No murmur heard. Pulmonary/Chest: Effort normal and breath sounds normal.  Lungs clear to auscultation bilaterally.  Symmetric chest rise.  No wheezing, rales, rhonchi.  Abdominal: Soft. Normal appearance. There is generalized tenderness and tenderness in the left lower quadrant. There is no rigidity, no guarding and no CVA tenderness.  Is soft, nondistended.  Generalized tenderness is slightly worse than left lower quadrant.  No rigidity, guarding. No CVA tenderness noted bilaterally. No tendenress noted to [facility]  Musculoskeletal: Normal range of motion.  Neurological: He is alert and oriented to person, place, and time.  Skin: Skin is warm and dry. Capillary refill takes less than 2 seconds. No purpura noted.  No purpura noted.  Psychiatric: He has a normal mood and affect. His speech is normal.  Nursing note and vitals reviewed.    ED Treatments / Results  Labs (all labs ordered are listed, but only abnormal results are displayed) Labs Reviewed  COMPREHENSIVE METABOLIC PANEL - Abnormal; Notable for the following components:      Result Value   BUN 23 (*)    Calcium 8.7 (*)    All other components within normal limits  CBC WITH DIFFERENTIAL/PLATELET  LIPASE, BLOOD  URINALYSIS, ROUTINE W REFLEX MICROSCOPIC    EKG None  Radiology No results found.  Procedures Procedures (including critical care time)  Medications Ordered in ED Medications  haloperidol lactate (HALDOL)  injection 2 mg (2 mg Intravenous Refused 12/16/17 1244)  HYDROmorphone (DILAUDID) injection 0.5 mg (has no administration in time range)  sodium chloride 0.9 % bolus 1,000 mL (0 mLs Intravenous Stopped 12/16/17 0916)  HYDROmorphone (DILAUDID) injection 1 mg (1 mg Intravenous Given 12/16/17 0913)  sodium chloride 0.9 % bolus 1,000 mL (0 mLs Intravenous Stopped 12/16/17 1558)  HYDROmorphone (DILAUDID) injection 1 mg (1 mg Intravenous Given 12/16/17 1054)  HYDROmorphone (DILAUDID) injection 1 mg (1 mg Intravenous Given 12/16/17 1345)  oxyCODONE-acetaminophen (PERCOCET/ROXICET) 5-325 MG per tablet 1 tablet (1 tablet Oral Given 12/16/17 1447)  HYDROmorphone (DILAUDID) injection 0.5 mg (0.5 mg Intravenous Given 12/16/17 1447)  ondansetron (ZOFRAN) injection 4 mg (4 mg Intravenous Given 12/16/17 1531)     Initial Impression / Assessment and Plan / ED Course  I have reviewed the triage vital signs and the nursing notes.  Pertinent labs & imaging results that  were available during my care of the patient were reviewed by me and considered in my medical decision making (see chart for details).     22 year old male who presents for evaluation of generalized abdominal pain and vomiting at home.  Reports been ongoing for the last several days.  No fevers.  No blood noted in emesis.  He feels that this is a flare of his HSP.  Was recently seen in the ED few days ago for similar symptoms.  Reports that his primary care doctor is out of town so he cannot get any pain medication Patient is afebrile, appears uncomfortable but no acute distress. Vital signs reviewed and stable.  On exam, abdomen is soft, nondistended.  No rigidity, guarding.  He has diffuse tenderness with no focal point.  No CVA tenderness bilaterally.  Suspect this is chronic abdominal pain.  Do not suspect acute HSP.  Additionally, no concern for surgical abdomen.  CMP shows BUN of 23.  Otherwise unremarkable.  Lipase unremarkable.  CBC without  any significant leukocytosis or anemia.  Reevaluation after analgesics.  He reports his pain is gone down 28.  He is requesting more pain medication.  Review of records show that he had previous episode when he was seen here in the ED a few days ago, he reports that it normally takes a couple rounds of pain medication for his pain to get to a manageable level.  RN informed me that patient was refusing Haldol.  I discussed with patient regarding concern for increased use of Dilaudid for his pain control.  He states that the Haldol typically does not work for his pain and that Dilaudid is the only thing that brings it down.  Instructed patient that I will give him 1 additional round of analgesics and reassess.  Reevaluation.  Patient ports he feels better but he is worried that he will need something more to go home.  He states that writing home in the car makes his pain worse.  I discussed management will give him 1 more dose of pain medication.  Patient is requesting narcotics to go home with.  Discussed with patient that I feel that this is related to his chronic pain and will not provide him any home narcotics.  Instructed that he can follow-up with his primary care doctor.   Reevaluation.  Vital signs are stable.  Patient to repeat abdominal exam is improved.  He is talking to me and is easily distracted during my exam.  Exam is not concerning for perforation, obstruction, acute infectious process.  No concern for surgical abdomen.  No indication for CT and pelvis.  Instructed patient to follow-up with his primary care doctor. Patient had ample opportunity for questions and discussion. All patient's questions were answered with full understanding. Strict return precautions discussed. Patient expresses understanding and agreement to plan.      Final Clinical Impressions(s) / ED Diagnoses   Final diagnoses:  Chronic abdominal pain  HSP (Henoch Schonlein purpura) [facility])    ED Discharge Orders     None       Maxwell Caul, PA-C 12/16/17 1611    Virgina Norfolk, DO 12/16/17 2034

## 2017-12-16 NOTE — Discharge Instructions (Signed)
Follow-up with your primary care doctor regarding your pain.  Take your pain medications as directed.  Return to emergency department for fever worsening pain, inability to eat or drink anything, persistent vomiting or any other worsening or concerning symptoms.

## 2017-12-16 NOTE — ED Notes (Signed)
Patient given discharge teaching and verbalized understanding. Patient ambulated out of ED with a steady gait. 

## 2017-12-16 NOTE — ED Notes (Signed)
This Clinical research associate gave patient a cup of water. Patient took a sip of water and stated he was barley able to keep water down. He stated he couldn't drink any more water.

## 2017-12-16 NOTE — ED Notes (Signed)
Went to discharge patient, patient is requesting to speak with provider before being discharged. Placed a message for [place], [place] to see patient.

## 2017-12-16 NOTE — ED Triage Notes (Signed)
Pt complains of abdominal pain for one week, he states that he has HCP and usually he can manage the flares but wasn't able to this time

## 2017-12-30 ENCOUNTER — Encounter (HOSPITAL_COMMUNITY): Payer: Self-pay | Admitting: Emergency Medicine

## 2017-12-30 ENCOUNTER — Other Ambulatory Visit: Payer: Self-pay

## 2017-12-30 ENCOUNTER — Emergency Department (HOSPITAL_COMMUNITY)
Admission: EM | Admit: 2017-12-30 | Discharge: 2017-12-30 | Disposition: A | Discharge status: Home / Self Care | Payer: BLUE CROSS/BLUE SHIELD | Attending: Emergency Medicine | Admitting: Emergency Medicine

## 2017-12-30 DIAGNOSIS — Z79899 Other long term (current) drug therapy: Secondary | ICD-10-CM | POA: Insufficient documentation

## 2017-12-30 DIAGNOSIS — G8929 Other chronic pain: Secondary | ICD-10-CM

## 2017-12-30 DIAGNOSIS — R1032 Left lower quadrant pain: Secondary | ICD-10-CM | POA: Diagnosis not present

## 2017-12-30 DIAGNOSIS — R109 Unspecified abdominal pain: Secondary | ICD-10-CM

## 2017-12-30 LAB — LIPASE, BLOOD: LIPASE: 43 U/L (ref 11–51)

## 2017-12-30 LAB — COMPREHENSIVE METABOLIC PANEL
ALK PHOS: 54 U/L (ref 38–126)
ALT: 40 U/L (ref 0–44)
AST: 60 U/L — ABNORMAL HIGH (ref 15–41)
Albumin: 4.2 g/dL (ref 3.5–5.0)
Anion gap: 8 (ref 5–15)
BUN: 12 mg/dL (ref 6–20)
CALCIUM: 8.9 mg/dL (ref 8.9–10.3)
CHLORIDE: 105 mmol/L (ref 98–111)
CO2: 26 mmol/L (ref 22–32)
CREATININE: 1.14 mg/dL (ref 0.61–1.24)
GFR calc Af Amer: >60 mL/min (ref >60)
Glucose, Bld: 95 mg/dL (ref 70–99)
Potassium: 3.7 mmol/L (ref 3.5–5.1)
Sodium: 139 mmol/L (ref 135–145)
Total Bilirubin: 0.9 mg/dL (ref 0.3–1.2)
Total Protein: 7.1 g/dL (ref 6.5–8.1)

## 2017-12-30 LAB — URINALYSIS, ROUTINE W REFLEX MICROSCOPIC
BILIRUBIN URINE: NEGATIVE
GLUCOSE, UA: NEGATIVE mg/dL
Hgb urine dipstick: NEGATIVE
KETONES UR: NEGATIVE mg/dL
Leukocytes, UA: NEGATIVE
Nitrite: NEGATIVE
PROTEIN: NEGATIVE mg/dL
Specific Gravity, Urine: 1.005 (ref 1.005–1.030)
pH: 7 (ref 5.0–8.0)

## 2017-12-30 LAB — CBC WITH DIFFERENTIAL/PLATELET
ABS IMMATURE GRANULOCYTES: 0.03 10*3/uL (ref 0.00–0.07)
BASOS PCT: 0 %
Basophils Absolute: 0 10*3/uL (ref 0.0–0.1)
EOS ABS: 0.1 10*3/uL (ref 0.0–0.5)
Eosinophils Relative: 2 %
HCT: 39.8 % (ref 39.0–52.0)
Hemoglobin: 13.9 g/dL (ref 13.0–17.0)
Immature Granulocytes: 0 %
Lymphocytes Relative: 23 %
Lymphs Abs: 1.9 10*3/uL (ref 0.7–4.0)
MCH: 32.1 pg (ref 26.0–34.0)
MCHC: 34.9 g/dL (ref 30.0–36.0)
MCV: 91.9 fL (ref 80.0–100.0)
MONOS PCT: 6 %
Monocytes Absolute: 0.5 10*3/uL (ref 0.1–1.0)
NEUTROS ABS: 5.6 10*3/uL (ref 1.7–7.7)
Neutrophils Relative %: 69 %
PLATELETS: 186 10*3/uL (ref 150–400)
RBC: 4.33 MIL/uL (ref 4.22–5.81)
RDW: 12 % (ref 11.5–15.5)
WBC: 8.1 10*3/uL (ref 4.0–10.5)
nRBC: 0 % (ref 0.0–0.2)

## 2017-12-30 MED ORDER — HYDROMORPHONE HCL 1 MG/ML IJ SOLN
1.0000 mg | Freq: Once | INTRAMUSCULAR | Status: AC
  Administered 2017-12-30: 1 mg via INTRAVENOUS
  Filled 2017-12-30: qty 1

## 2017-12-30 MED ORDER — ONDANSETRON HCL 4 MG/2ML IJ SOLN
4.0000 mg | Freq: Once | INTRAMUSCULAR | Status: AC
  Administered 2017-12-30: 4 mg via INTRAVENOUS
  Filled 2017-12-30: qty 2

## 2017-12-30 MED ORDER — SODIUM CHLORIDE 0.9 % IV BOLUS
1000.0000 mL | Freq: Once | INTRAVENOUS | Status: AC
  Administered 2017-12-30: 1000 mL via INTRAVENOUS

## 2017-12-30 MED ORDER — PREDNISONE 20 MG PO TABS: 40.0000 mg | ORAL_TABLET | Freq: Every day | ORAL | 0 refills | Status: DC

## 2017-12-30 MED ORDER — KETAMINE HCL 50 MG/5ML IJ SOSY
0.3000 mg/kg | PREFILLED_SYRINGE | Freq: Once | INTRAMUSCULAR | Status: AC
  Administered 2017-12-30: 31 mg via INTRAVENOUS
  Filled 2017-12-30: qty 5

## 2017-12-30 NOTE — Care Management Note (Signed)
Case Management Note  CM noted pt's return and discussed the possibility of [facility] RN with Dr. Rubin Payor who was agreeable to providing.  CM spoke with pt who politely declined HH.  He reports that his issue is not something that he feels would be helped by a [facility] RN from his knowledge on their services.  He thanked CM for offering.  Updated Dr. Rubin Payor.  No further CM needs noted at this time.  Lore Polka, Lynnae Sandhoff, RN 12/30/2017, 10:20 AM

## 2017-12-30 NOTE — ED Notes (Signed)
Patient given discharge teaching and verbalized understanding. Patient ambulated out of ED with a steady gait. 

## 2017-12-30 NOTE — ED Provider Notes (Signed)
[facility] Provider Note   CSN: [id number] Arrival date & time: 12/30/17  0245     History   Chief Complaint Chief Complaint  Patient presents with  . Abdominal Pain    HPI Gregory Conway is a 22 y.o. male.  The history is provided by the patient.  He has history of attention deficit disorder, GERD, Henoch-Schnlein purpura, chronic abdominal pain comes in with recurrence of left lower quadrant pain that started 4 days ago.  Pain is severe and he rates it at 8/10.  There has been associated nausea and vomiting.  He has had oxycodone at home in the past, but had run out.  He has been taking acetaminophen and ibuprofen without relief.  He denies fever, chills, sweats.  Denies urinary symptoms.  Pain is similar to prior episodes of abdominal pain.  Past Medical History:  Diagnosis Date  . Acne   . ADHD   . GERD (gastroesophageal reflux disease)   . HSP (Henoch Schonlein purpura) (HCC)     There are no active problems to display for this patient.   History reviewed. No pertinent surgical history.      Home Medications    Prior to Admission medications   Medication Sig Start Date End Date Taking? Authorizing Provider  acetaminophen (TYLENOL) 325 MG tablet Take 650 mg by mouth every 6 (six) hours as needed for mild pain.    [provider]  ALPRAZolam Prudy Feeler) 1 MG tablet Take 1 mg by mouth daily as needed for anxiety. 06/22/17   [provider]  dexmethylphenidate (FOCALIN XR) 10 MG 24 hr capsule Take 10 mg by mouth daily.  04/03/16   [provider]  DULoxetine (CYMBALTA) 30 MG capsule Take 90 mg by mouth daily.  06/22/17   [provider]  gabapentin (NEURONTIN) 300 MG capsule Take 300 mg by mouth 3 (three) times daily as needed for pain. 07/28/17   [provider]  HUMIRA PEN 40 MG/0.4ML PNKT Inject 40 mg into the skin once a week.  12/05/16   [provider]  linaclotide (LINZESS) 72  MCG capsule Take 72 mcg by mouth daily as needed (diarrhea).  03/13/17   [provider]  Multiple Vitamins-Minerals (MULTIVITAMIN ADULT) TABS Take 1 tablet by mouth 3 (three) times a week.     [provider]  ondansetron (ZOFRAN ODT) 8 MG disintegrating tablet Take 1 tablet (8 mg total) by mouth every 8 (eight) hours as needed for nausea or vomiting. 12/11/17   Azalia Bilis, MD  promethazine (PHENERGAN) 25 MG tablet Take 1 tablet (25 mg total) by mouth every 6 (six) hours as needed for nausea or vomiting. 05/28/17   Charlestine Night, PA-C    Family History Family History  Problem Relation Age of Onset  . Lupus Mother   . Hypertension Father     Social History Social History   Tobacco Use  . Smoking status: Never Smoker  . Smokeless tobacco: Never Used  Substance Use Topics  . Alcohol use: Yes    Comment: rarely  . Drug use: No     Allergies   Morphine and related; Penicillins; and Tramadol   Review of Systems Review of Systems  All other systems reviewed and are negative.    Physical Exam Updated Vital Signs BP (!) 142/92   Pulse 91   Temp (!) 97.4 F (36.3 C) (Oral)   Resp 20   SpO2 99%   Physical Exam  Nursing note  and vitals reviewed.  22 year old male, appears uncomfortable, but is in no acute distress. Vital signs are significant for mild elevation of blood pressure. Oxygen saturation is 99%, which is normal. Head is normocephalic and atraumatic. PERRLA, EOMI. Oropharynx is clear. Neck is nontender and supple without adenopathy or JVD. Back is nontender and there is no CVA tenderness. Lungs are clear without rales, wheezes, or rhonchi. Chest is nontender. Heart has regular rate and rhythm without murmur. Abdomen is soft, flat, with mild to moderate left lower quadrant tenderness.  There is no rebound or guarding.  There are no masses or hepatosplenomegaly and peristalsis is hypoactive. Extremities have no cyanosis or edema, full range  of motion is present. Skin is warm and dry without rash. Neurologic: Mental status is normal, cranial nerves are intact, there are no motor or sensory deficits.  ED Treatments / Results  Labs (all labs ordered are listed, but only abnormal results are displayed) Labs Reviewed  URINALYSIS, ROUTINE W REFLEX MICROSCOPIC  CBC WITH DIFFERENTIAL/PLATELET  COMPREHENSIVE METABOLIC PANEL  LIPASE, BLOOD   Procedures Procedures   Medications Ordered in ED Medications  sodium chloride 0.9 % bolus 1,000 mL (has no administration in time range)  ondansetron (ZOFRAN) injection 4 mg (has no administration in time range)  ketamine 50 mg in normal saline 5 mL (10 mg/mL) syringe (has no administration in time range)     Initial Impression / Assessment and Plan / ED Course  I have reviewed the triage vital signs and the nursing notes.  Pertinent lab results that were available during my care of the patient were reviewed by me and considered in my medical decision making (see chart for details).  Recurrence of chronic abdominal pain.  Although he has a history of Henoch-Schnlein purpura, no skin rash is present to suggest that as a cause of his current pain.  Old records are reviewed, and he has numerous ED visits for similar complaints and usually gets several injections of hydromorphone.  Labs today are reassuring.  No indication for advanced imaging.  Will try ketamine as opposed to opioids to see if that will give him adequate relief.  If he fails ketamine, will give hydromorphone.  Ketamine failed to give pain relief.  He is given a dose of hydromorphone which is given modest pain relief but he does not feel he has achieved sufficient relief to go home.  He is given a second dose of hydromorphone.  On reviewing prior ED visits, he has generally required at least 3 or 4 doses of hydromorphone.  Case is signed out to Dr. Rubin Payor.  Final Clinical Impressions(s) / ED Diagnoses   Final diagnoses:    LLQ abdominal pain    ED Discharge Orders    None       Dione Booze, MD 12/30/17 [id number]

## 2017-12-30 NOTE — ED Triage Notes (Signed)
Pt from home with c/o HSP and GERD. Pt states pain is similar to his chronic pain and is in the lower left quadrant. Pt rates pain 9/10. Pt reports he has had emesis secondary to pain

## 2017-12-30 NOTE — ED Provider Notes (Signed)
  Physical Exam  BP (!) 140/91 (BP Location: Left Arm)   Pulse 88   Temp (!) 97.4 F (36.3 C) (Oral)   Resp 16   SpO2 96%   Physical Exam  ED Course/Procedures     Procedures  MDM  Received patient in signout.  Acute on chronic abdominal pain.  Thought to be neuropathic due to previous HSP.  Labs reassuring.  Pain.  Will give steroids for home since it is worked in the past.  Had some mild relief with ketamine but not persistent.  Discharge home.       Benjiman Core, MD 12/30/17 [phone]

## 2018-01-03 DIAGNOSIS — F988 Other specified behavioral and emotional disorders with onset usually occurring in childhood and adolescence: Secondary | ICD-10-CM | POA: Insufficient documentation

## 2018-01-20 ENCOUNTER — Ambulatory Visit: Payer: Self-pay | Admitting: Psychiatry

## 2018-01-22 ENCOUNTER — Encounter (HOSPITAL_COMMUNITY): Payer: Self-pay

## 2018-01-22 ENCOUNTER — Emergency Department (HOSPITAL_COMMUNITY)
Admission: EM | Admit: 2018-01-22 | Discharge: 2018-01-22 | Disposition: A | Discharge status: Home / Self Care | Payer: BLUE CROSS/BLUE SHIELD | Attending: Emergency Medicine | Admitting: Emergency Medicine

## 2018-01-22 ENCOUNTER — Emergency Department (HOSPITAL_COMMUNITY): Payer: BLUE CROSS/BLUE SHIELD

## 2018-01-22 ENCOUNTER — Other Ambulatory Visit: Payer: Self-pay

## 2018-01-22 DIAGNOSIS — R1030 Lower abdominal pain, unspecified: Secondary | ICD-10-CM

## 2018-01-22 DIAGNOSIS — K219 Gastro-esophageal reflux disease without esophagitis: Secondary | ICD-10-CM | POA: Diagnosis not present

## 2018-01-22 DIAGNOSIS — Z79899 Other long term (current) drug therapy: Secondary | ICD-10-CM | POA: Diagnosis not present

## 2018-01-22 DIAGNOSIS — R1032 Left lower quadrant pain: Secondary | ICD-10-CM | POA: Diagnosis not present

## 2018-01-22 LAB — COMPREHENSIVE METABOLIC PANEL
ALK PHOS: 59 U/L (ref 38–126)
ALT: 103 U/L — ABNORMAL HIGH (ref 0–44)
AST: 60 U/L — ABNORMAL HIGH (ref 15–41)
Albumin: 4.6 g/dL (ref 3.5–5.0)
Anion gap: 10 (ref 5–15)
BUN: 18 mg/dL (ref 6–20)
CALCIUM: 9 mg/dL (ref 8.9–10.3)
CO2: 24 mmol/L (ref 22–32)
Chloride: 107 mmol/L (ref 98–111)
Creatinine, Ser: 1.15 mg/dL (ref 0.61–1.24)
GFR calc Af Amer: >60 mL/min (ref >60)
GFR calc non Af Amer: >60 mL/min (ref >60)
Glucose, Bld: 85 mg/dL (ref 70–99)
Potassium: 3.7 mmol/L (ref 3.5–5.1)
SODIUM: 141 mmol/L (ref 135–145)
Total Bilirubin: 1 mg/dL (ref 0.3–1.2)
Total Protein: 7.5 g/dL (ref 6.5–8.1)

## 2018-01-22 LAB — CBC WITH DIFFERENTIAL/PLATELET
Abs Immature Granulocytes: 0.03 10*3/uL (ref 0.00–0.07)
Basophils Absolute: 0.1 10*3/uL (ref 0.0–0.1)
Basophils Relative: 1 %
Eosinophils Absolute: 0.1 10*3/uL (ref 0.0–0.5)
Eosinophils Relative: 1 %
HCT: 44.4 % (ref 39.0–52.0)
HEMOGLOBIN: 15.9 g/dL (ref 13.0–17.0)
Immature Granulocytes: 0 %
Lymphocytes Relative: 18 %
Lymphs Abs: 1.9 10*3/uL (ref 0.7–4.0)
MCH: 33.1 pg (ref 26.0–34.0)
MCHC: 35.8 g/dL (ref 30.0–36.0)
MCV: 92.3 fL (ref 80.0–100.0)
Monocytes Absolute: 0.8 10*3/uL (ref 0.1–1.0)
Monocytes Relative: 7 %
Neutro Abs: 7.8 10*3/uL — ABNORMAL HIGH (ref 1.7–7.7)
Neutrophils Relative %: 73 %
Platelets: 192 10*3/uL (ref 150–400)
RBC: 4.81 MIL/uL (ref 4.22–5.81)
RDW: 11.9 % (ref 11.5–15.5)
WBC: 10.8 10*3/uL — ABNORMAL HIGH (ref 4.0–10.5)
nRBC: 0 % (ref 0.0–0.2)

## 2018-01-22 LAB — LIPASE, BLOOD: Lipase: 33 U/L (ref 11–51)

## 2018-01-22 MED ORDER — SODIUM CHLORIDE 0.9 % IV BOLUS
500.0000 mL | Freq: Once | INTRAVENOUS | Status: AC
  Administered 2018-01-22: 500 mL via INTRAVENOUS

## 2018-01-22 MED ORDER — HYDROMORPHONE HCL 1 MG/ML IJ SOLN: 1.0000 mg | Freq: Once | INTRAMUSCULAR | Status: DC

## 2018-01-22 MED ORDER — IBUPROFEN 800 MG PO TABS: 800.0000 mg | ORAL_TABLET | Freq: Three times a day (TID) | ORAL | 0 refills | Status: DC | PRN

## 2018-01-22 MED ORDER — ONDANSETRON HCL 4 MG/2ML IJ SOLN
4.0000 mg | Freq: Once | INTRAMUSCULAR | Status: AC
  Administered 2018-01-22: 4 mg via INTRAVENOUS
  Filled 2018-01-22: qty 2

## 2018-01-22 MED ORDER — IOPAMIDOL (ISOVUE-300) INJECTION 61%
INTRAVENOUS | Status: AC
  Filled 2018-01-22: qty 100

## 2018-01-22 MED ORDER — IOPAMIDOL (ISOVUE-300) INJECTION 61%
100.0000 mL | Freq: Once | INTRAVENOUS | Status: AC | PRN
  Administered 2018-01-22: 100 mL via INTRAVENOUS

## 2018-01-22 MED ORDER — HYDROMORPHONE HCL 1 MG/ML IJ SOLN
1.0000 mg | Freq: Once | INTRAMUSCULAR | Status: AC
  Administered 2018-01-22: 1 mg via INTRAVENOUS
  Filled 2018-01-22: qty 1

## 2018-01-22 MED ORDER — SODIUM CHLORIDE (PF) 0.9 % IJ SOLN
INTRAMUSCULAR | Status: AC
  Filled 2018-01-22: qty 50

## 2018-01-22 MED ORDER — IOPAMIDOL (ISOVUE-300) INJECTION 61%
INTRAVENOUS | Status: AC
  Administered 2018-01-22: 100 mL via INTRAVENOUS
  Filled 2018-01-22: qty 100

## 2018-01-22 MED ORDER — SODIUM CHLORIDE 0.9 % IV BOLUS
1000.0000 mL | Freq: Once | INTRAVENOUS | Status: AC
  Administered 2018-01-22: 1000 mL via INTRAVENOUS

## 2018-01-22 NOTE — ED Triage Notes (Signed)
Pt from home with reports of abd pain. Pt states that he has chronic pain from hx of HSP. Pt reports vomiting due to severe pain.

## 2018-01-22 NOTE — Discharge Instructions (Addendum)
Follow-up with your doctor next week if any problems 

## 2018-01-22 NOTE — ED Provider Notes (Signed)
[facility]-EMERGENCY DEPT Provider Note   CSN: [id number] Arrival date & time: 01/22/18  4010     History   Chief Complaint Chief Complaint  Patient presents with  . Abdominal Pain    HPI Gregory Conway is a 22 y.o. male.  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  Patient states she started to have some left lower quadrant abdominal pain  The history is provided by the patient. No language interpreter was used.  Abdominal Pain   This is a chronic problem. The current episode started 12 to 24 hours ago. The problem occurs constantly. The problem has not changed since onset.The pain is associated with an unknown factor. The pain is located in the LLQ. The quality of the pain is aching. The pain is at a severity of 5/10. The pain is moderate. Pertinent negatives include diarrhea, flatus, frequency, hematuria and headaches. Nothing aggravates the symptoms.    Past Medical History:  Diagnosis Date  . Acne   . ADHD   . GERD (gastroesophageal reflux disease)   . HSP (Henoch Schonlein purpura) [facility])     Patient Active Problem List   Diagnosis Date Noted  . ADD (attention deficit disorder) 01/03/2018    History reviewed. No pertinent surgical history.      Home Medications    Prior to Admission medications   Medication Sig Start Date End Date Taking? Authorizing Provider  acetaminophen (TYLENOL) 325 MG tablet Take 650 mg by mouth every 6 (six) hours as needed for mild pain.   Yes [provider]  ALPRAZolam Prudy Feeler) 1 MG tablet Take 1 mg by mouth daily as needed for anxiety. 06/22/17  Yes [provider]  dexmethylphenidate (FOCALIN) 10 MG tablet Take 10 mg by mouth daily as needed (mainly uses to help during school).  01/12/18  Yes [provider]  DULoxetine (CYMBALTA) 30 MG capsule Take 90 mg by mouth daily.  06/22/17  Yes [provider]  fluticasone (FLONASE) 50 MCG/ACT nasal spray Place 1 spray  into both nostrils as needed for allergies or rhinitis.   Yes [provider]  gabapentin (NEURONTIN) 300 MG capsule Take 300 mg by mouth 3 (three) times daily as needed for pain. 07/28/17  Yes [provider]  HUMIRA PEN 40 MG/0.4ML PNKT Inject 40 mg into the skin once a week.  12/05/16  Yes [provider]  linaclotide (LINZESS) 72 MCG capsule Take 72 mcg by mouth daily as needed (diarrhea).  03/13/17  Yes [provider]  Multiple Vitamins-Minerals (MULTIVITAMIN ADULT) TABS Take 1 tablet by mouth daily.    Yes [provider]  ondansetron (ZOFRAN ODT) 8 MG disintegrating tablet Take 1 tablet (8 mg total) by mouth every 8 (eight) hours as needed for nausea or vomiting. 12/11/17  Yes Azalia Bilis, MD  oxyCODONE-acetaminophen (PERCOCET) 10-325 MG tablet Take 1 tablet by mouth every 6 (six) hours as needed for pain. 01/12/18  Yes [provider]  promethazine (PHENERGAN) 25 MG tablet Take 1 tablet (25 mg total) by mouth every 6 (six) hours as needed for nausea or vomiting. 05/28/17  Yes Lawyer, Cristal Deer, PA-C  citalopram (CELEXA) 10 MG tablet Take 10 mg by mouth daily. 1/2 - 1 tab    [provider]  dexmethylphenidate (FOCALIN XR) 10 MG 24 hr capsule Take 10 mg by mouth daily.  04/03/16   [provider]  ibuprofen (ADVIL,MOTRIN) 800 MG tablet Take 1 tablet (800 mg total) by mouth every  8 (eight) hours as needed for moderate pain. 01/22/18   Bethann BerkshireZammit, Avrom Robarts, MD  predniSONE (DELTASONE) 20 MG tablet Take 2 tablets (40 mg total) by mouth daily. Patient not taking: Reported on 01/22/2018 12/30/17   Benjiman CorePickering, Nathan, MD    Family History Family History  Problem Relation Age of Onset  . Lupus Mother   . Hypertension Father     Social History Social History   Tobacco Use  . Smoking status: Never Smoker  . Smokeless tobacco: Never Used  Substance Use Topics  . Alcohol use: Yes    Comment: rarely  . Drug use: No      Allergies   Morphine and related; Penicillins; and Tramadol   Review of Systems Review of Systems  Constitutional: Negative for appetite change and fatigue.  HENT: Negative for congestion, ear discharge and sinus pressure.   Eyes: Negative for discharge.  Respiratory: Negative for cough.   Cardiovascular: Negative for chest pain.  Gastrointestinal: Positive for abdominal pain. Negative for diarrhea and flatus.  Genitourinary: Negative for frequency and hematuria.  Musculoskeletal: Negative for back pain.  Skin: Negative for rash.  Neurological: Negative for seizures and headaches.  Psychiatric/Behavioral: Negative for hallucinations.     Physical Exam Updated Vital Signs BP 118/73   Pulse 78   Temp 97.8 F (36.6 C)   Resp 18   SpO2 96%   Physical Exam  Constitutional: He is oriented to person, place, and time. He appears well-developed.  HENT:  Head: Normocephalic.  Eyes: Conjunctivae and EOM are normal. No scleral icterus.  Neck: Neck supple. No thyromegaly present.  Cardiovascular: Normal rate and regular rhythm. Exam reveals no gallop and no friction rub.  No murmur heard. Pulmonary/Chest: No stridor. He has no wheezes. He has no rales. He exhibits no tenderness.  Abdominal: He exhibits no distension. There is tenderness. There is no rebound.  Moderate left lower quadrant tenderness  Musculoskeletal: Normal range of motion. He exhibits no edema.  Lymphadenopathy:    He has no cervical adenopathy.  Neurological: He is oriented to person, place, and time. He exhibits normal muscle tone. Coordination normal.  Skin: No rash noted. No erythema.  Psychiatric: He has a normal mood and affect. His behavior is normal.     ED Treatments / Results  Labs (all labs ordered are listed, but only abnormal results are displayed) Labs Reviewed  CBC WITH DIFFERENTIAL/PLATELET - Abnormal; Notable for the following components:      Result Value   WBC 10.8 (*)    Neutro  Abs 7.8 (*)    All other components within normal limits  COMPREHENSIVE METABOLIC PANEL - Abnormal; Notable for the following components:   AST 60 (*)    ALT 103 (*)    All other components within normal limits  LIPASE, BLOOD  URINALYSIS, ROUTINE W REFLEX MICROSCOPIC    EKG None  Radiology Ct Abdomen Pelvis W Contrast  Result Date: 01/22/2018 CLINICAL DATA:  Abdominal distention. EXAM: CT ABDOMEN AND PELVIS WITH CONTRAST TECHNIQUE: Multidetector CT imaging of the abdomen and pelvis was performed using the standard protocol following bolus administration of intravenous contrast. CONTRAST:  100 mL ISOVUE-300 IOPAMIDOL (ISOVUE-300) INJECTION 61% COMPARISON:  06/24/2016 FINDINGS: Lower chest: Lung bases are clear. No effusions. Heart is normal size. Hepatobiliary: No focal hepatic abnormality. Gallbladder unremarkable. Pancreas: No focal abnormality or ductal dilatation. Spleen: No focal abnormality.  Normal size. Adrenals/Urinary Tract: No adrenal abnormality. No focal renal abnormality. No stones or hydronephrosis. Urinary bladder is unremarkable. Stomach/Bowel:  Stomach, large and small bowel grossly unremarkable. Appendix normal. Vascular/Lymphatic: No evidence of aneurysm or adenopathy. Reproductive: No visible focal abnormality. Other: No free fluid or free air. Musculoskeletal: No acute bony abnormality. IMPRESSION: No acute abnormality in the abdomen or pelvis. Electronically Signed   By: Charlett Nose M.D.   On: 01/22/2018 15:34    Procedures Procedures (including critical care time)  Medications Ordered in ED Medications  sodium chloride (PF) 0.9 % injection (has no administration in time range)  HYDROmorphone (DILAUDID) injection 1 mg (has no administration in time range)  sodium chloride 0.9 % bolus 1,000 mL (0 mLs Intravenous Stopped 01/22/18 1157)  HYDROmorphone (DILAUDID) injection 1 mg (1 mg Intravenous Given 01/22/18 1002)  ondansetron (ZOFRAN) injection 4 mg (4 mg Intravenous  Given 01/22/18 1002)  HYDROmorphone (DILAUDID) injection 1 mg (1 mg Intravenous Given 01/22/18 1201)  ondansetron (ZOFRAN) injection 4 mg (4 mg Intravenous Given 01/22/18 1201)  HYDROmorphone (DILAUDID) injection 1 mg (1 mg Intravenous Given 01/22/18 1253)  sodium chloride 0.9 % bolus 500 mL (0 mLs Intravenous Stopped 01/22/18 1429)  HYDROmorphone (DILAUDID) injection 1 mg (1 mg Intravenous Given 01/22/18 1344)  iopamidol (ISOVUE-300) 61 % injection 100 mL (100 mLs Intravenous Contrast Given 01/22/18 1522)     Initial Impression / Assessment and Plan / ED Course  I have reviewed the triage vital signs and the nursing notes.  Pertinent labs & imaging results that were available during my care of the patient were reviewed by me and considered in my medical decision making (see chart for details).     White count mildly elevated.  CT scan unremarkable.  Patient will be discharged home with Motrin for discomfort and will follow-up with his doctor  Final Clinical Impressions(s) / ED Diagnoses   Final diagnoses:  Lower abdominal pain    ED Discharge Orders         Ordered    ibuprofen (ADVIL,MOTRIN) 800 MG tablet  Every 8 hours PRN     01/22/18 1543           Bethann Berkshire, MD 01/23/18 [phone]

## 2018-02-04 ENCOUNTER — Emergency Department (HOSPITAL_COMMUNITY)
Admission: EM | Admit: 2018-02-04 | Discharge: 2018-02-04 | Disposition: A | Discharge status: Home / Self Care | Payer: BLUE CROSS/BLUE SHIELD | Attending: Emergency Medicine | Admitting: Emergency Medicine

## 2018-02-04 ENCOUNTER — Encounter (HOSPITAL_COMMUNITY): Payer: Self-pay | Admitting: Emergency Medicine

## 2018-02-04 DIAGNOSIS — R1032 Left lower quadrant pain: Secondary | ICD-10-CM | POA: Diagnosis not present

## 2018-02-04 DIAGNOSIS — R1110 Vomiting, unspecified: Secondary | ICD-10-CM | POA: Insufficient documentation

## 2018-02-04 DIAGNOSIS — Z79899 Other long term (current) drug therapy: Secondary | ICD-10-CM | POA: Insufficient documentation

## 2018-02-04 LAB — COMPREHENSIVE METABOLIC PANEL
ALT: 20 U/L (ref 0–44)
AST: 19 U/L (ref 15–41)
Albumin: 4.6 g/dL (ref 3.5–5.0)
Alkaline Phosphatase: 58 U/L (ref 38–126)
Anion gap: 8 (ref 5–15)
BILIRUBIN TOTAL: 1.4 mg/dL — AB (ref 0.3–1.2)
BUN: 13 mg/dL (ref 6–20)
CO2: 27 mmol/L (ref 22–32)
Calcium: 9.2 mg/dL (ref 8.9–10.3)
Chloride: 105 mmol/L (ref 98–111)
Creatinine, Ser: 1.13 mg/dL (ref 0.61–1.24)
GFR calc non Af Amer: >60 mL/min (ref >60)
Glucose, Bld: 115 mg/dL — ABNORMAL HIGH (ref 70–99)
Potassium: 3.4 mmol/L — ABNORMAL LOW (ref 3.5–5.1)
Sodium: 140 mmol/L (ref 135–145)
Total Protein: 7.2 g/dL (ref 6.5–8.1)

## 2018-02-04 LAB — CBC
HCT: 43.6 % (ref 39.0–52.0)
Hemoglobin: 15.5 g/dL (ref 13.0–17.0)
MCH: 33.1 pg (ref 26.0–34.0)
MCHC: 35.6 g/dL (ref 30.0–36.0)
MCV: 93.2 fL (ref 80.0–100.0)
Platelets: 231 10*3/uL (ref 150–400)
RBC: 4.68 MIL/uL (ref 4.22–5.81)
RDW: 12.1 % (ref 11.5–15.5)
WBC: 9.9 10*3/uL (ref 4.0–10.5)
nRBC: 0 % (ref 0.0–0.2)

## 2018-02-04 LAB — LIPASE, BLOOD: LIPASE: 44 U/L (ref 11–51)

## 2018-02-04 MED ORDER — HYDROMORPHONE HCL 1 MG/ML IJ SOLN
1.0000 mg | Freq: Once | INTRAMUSCULAR | Status: AC
  Administered 2018-02-04: 1 mg via INTRAVENOUS
  Filled 2018-02-04: qty 1

## 2018-02-04 MED ORDER — HYDROMORPHONE HCL 1 MG/ML IJ SOLN
1.0000 mg | Freq: Once | INTRAMUSCULAR | Status: AC
  Administered 2018-02-04: 1 mg via INTRAMUSCULAR
  Filled 2018-02-04: qty 1

## 2018-02-04 MED ORDER — ONDANSETRON HCL 4 MG/2ML IJ SOLN
4.0000 mg | Freq: Once | INTRAMUSCULAR | Status: AC
  Administered 2018-02-04: 4 mg via INTRAVENOUS
  Filled 2018-02-04: qty 2

## 2018-02-04 MED ORDER — SODIUM CHLORIDE 0.9 % IV BOLUS
1000.0000 mL | Freq: Once | INTRAVENOUS | Status: AC
  Administered 2018-02-04: 1000 mL via INTRAVENOUS

## 2018-02-04 MED ORDER — PROMETHAZINE HCL 25 MG/ML IJ SOLN
25.0000 mg | Freq: Once | INTRAMUSCULAR | Status: AC
  Administered 2018-02-04: 25 mg via INTRAMUSCULAR
  Filled 2018-02-04: qty 1

## 2018-02-04 MED ORDER — PROMETHAZINE HCL 25 MG/ML IJ SOLN: 25.0000 mg | Freq: Once | INTRAMUSCULAR | Status: DC

## 2018-02-04 NOTE — ED Provider Notes (Signed)
[facility]-EMERGENCY DEPT Provider Note   CSN: [id number] Arrival date & time: 02/04/18  0813     History   Chief Complaint Chief Complaint  Patient presents with  . Abdominal Pain  . Emesis    HPI Gregory Conway is a 22 y.o. male.  Patient is a 22 year old male with past medical history of ADHD, GERD, and HSP.  He was diagnosed with HSP in 2016 and states that he has had issues with chronic left lower quadrant abdominal pain ever since.  He has been on chronic opioid pain medication and has occasional flareups that he presents to the ED for.  He was seen here on November 29 with a similar episode.  He had extensive work-up performed including laboratory studies and CT scan, all of which were unremarkable.  The history is provided by the patient.  Abdominal Pain   This is a chronic problem. Episode onset: 3 days ago. The problem occurs constantly. The problem has been rapidly worsening. The pain is associated with an unknown factor. The pain is located in the LLQ. The pain is severe. Associated symptoms include vomiting. Pertinent negatives include fever, melena, nausea, dysuria and hematuria. The symptoms are aggravated by certain positions and palpation. Nothing relieves the symptoms.  Emesis   Associated symptoms include abdominal pain. Pertinent negatives include no fever.    Past Medical History:  Diagnosis Date  . Acne   . ADHD   . GERD (gastroesophageal reflux disease)   . HSP (Henoch Schonlein purpura) [facility])     Patient Active Problem List   Diagnosis Date Noted  . ADD (attention deficit disorder) 01/03/2018    History reviewed. No pertinent surgical history.      Home Medications    Prior to Admission medications   Medication Sig Start Date End Date Taking? Authorizing Provider  acetaminophen (TYLENOL) 325 MG tablet Take 650 mg by mouth every 6 (six) hours as needed for mild pain.    [provider]  ALPRAZolam Prudy Feeler) 1  MG tablet Take 1 mg by mouth daily as needed for anxiety. 06/22/17   [provider]  citalopram (CELEXA) 10 MG tablet Take 10 mg by mouth daily. 1/2 - 1 tab    [provider]  dexmethylphenidate (FOCALIN XR) 10 MG 24 hr capsule Take 10 mg by mouth daily.  04/03/16   [provider]  dexmethylphenidate (FOCALIN) 10 MG tablet Take 10 mg by mouth daily as needed (mainly uses to help during school).  01/12/18   [provider]  DULoxetine (CYMBALTA) 30 MG capsule Take 90 mg by mouth daily.  06/22/17   [provider]  fluticasone (FLONASE) 50 MCG/ACT nasal spray Place 1 spray into both nostrils as needed for allergies or rhinitis.    [provider]  gabapentin (NEURONTIN) 300 MG capsule Take 300 mg by mouth 3 (three) times daily as needed for pain. 07/28/17   [provider]  HUMIRA PEN 40 MG/0.4ML PNKT Inject 40 mg into the skin once a week.  12/05/16   [provider]  ibuprofen (ADVIL,MOTRIN) 800 MG tablet Take 1 tablet (800 mg total) by mouth every 8 (eight) hours as needed for moderate pain. 01/22/18   Bethann Berkshire, MD  linaclotide Karlene Einstein) 72 MCG capsule Take 72 mcg by mouth daily as needed (diarrhea).  03/13/17   [provider]  Multiple Vitamins-Minerals (MULTIVITAMIN ADULT) TABS Take 1 tablet by mouth daily.     [provider]  ondansetron (  ZOFRAN ODT) 8 MG disintegrating tablet Take 1 tablet (8 mg total) by mouth every 8 (eight) hours as needed for nausea or vomiting. 12/11/17   Azalia Bilisampos, Kevin, MD  oxyCODONE-acetaminophen (PERCOCET) 10-325 MG tablet Take 1 tablet by mouth every 6 (six) hours as needed for pain. 01/12/18   [provider]  predniSONE (DELTASONE) 20 MG tablet Take 2 tablets (40 mg total) by mouth daily. Patient not taking: Reported on 01/22/2018 12/30/17   Benjiman CorePickering, Nathan, MD  promethazine (PHENERGAN) 25 MG tablet Take 1 tablet (25 mg total) by mouth every 6 (six) hours as needed  for nausea or vomiting. 05/28/17   Charlestine NightLawyer, Christopher, PA-C    Family History Family History  Problem Relation Age of Onset  . Lupus Mother   . Hypertension Father     Social History Social History   Tobacco Use  . Smoking status: Never Smoker  . Smokeless tobacco: Never Used  Substance Use Topics  . Alcohol use: Yes    Comment: rarely  . Drug use: No     Allergies   Morphine and related; Penicillins; and Tramadol   Review of Systems Review of Systems  Constitutional: Negative for fever.  Gastrointestinal: Positive for abdominal pain and vomiting. Negative for melena and nausea.  Genitourinary: Negative for dysuria and hematuria.  All other systems reviewed and are negative.    Physical Exam Updated Vital Signs BP 131/87 (BP Location: Right Arm)   Pulse (!) 111   Temp 98.7 F (37.1 C) (Oral)   Resp 18   SpO2 100%   Physical Exam Vitals signs and nursing note reviewed.  Constitutional:      General: He is not in acute distress.    Appearance: He is well-developed. He is not diaphoretic.  HENT:     Head: Normocephalic and atraumatic.  Neck:     Musculoskeletal: Normal range of motion and neck supple.  Cardiovascular:     Rate and Rhythm: Normal rate and regular rhythm.     Heart sounds: No murmur. No friction rub.  Pulmonary:     Effort: Pulmonary effort is normal. No respiratory distress.     Breath sounds: Normal breath sounds. No wheezing or rales.  Abdominal:     General: Bowel sounds are normal. There is no distension.     Palpations: Abdomen is soft.     Tenderness: There is abdominal tenderness in the left lower quadrant. There is no left CVA tenderness, guarding or rebound.  Musculoskeletal: Normal range of motion.  Skin:    General: Skin is warm and dry.  Neurological:     Mental Status: He is alert and oriented to person, place, and time.     Coordination: Coordination normal.      ED Treatments / Results  Labs (all labs ordered are  listed, but only abnormal results are displayed) Labs Reviewed  LIPASE, BLOOD  COMPREHENSIVE METABOLIC PANEL  CBC  URINALYSIS, ROUTINE W REFLEX MICROSCOPIC    EKG None  Radiology No results found.  Procedures Procedures (including critical care time)  Medications Ordered in ED Medications  HYDROmorphone (DILAUDID) injection 1 mg (has no administration in time range)  promethazine (PHENERGAN) injection 25 mg (has no administration in time range)     Initial Impression / Assessment and Plan / ED Course  I have reviewed the triage vital signs and the nursing notes.  Pertinent labs & imaging results that were available during my care of the patient were reviewed by me and considered in  my medical decision making (see chart for details).  Patient with history of chronic abdominal pain secondary to HSP.  He presents today with complaints of abdominal pain.  He describes pain to the left lower quadrant that is persistent and unrelieved with home medications.  He tells me that when it gets this bad he requires "hydromorphone" and fluids.  His laboratory studies are reassuring.  CT scan performed 2 weeks ago was normal.  I see no indication for further imaging studies.  He was given IV fluids and medications and will be discharged.  This is a chronic condition and the patient needs to follow-up with his primary doctor for prescriptions for pain medication.  Final Clinical Impressions(s) / ED Diagnoses   Final diagnoses:  None    ED Discharge Orders    None       Geoffery Lyons, MD 02/04/18 1307

## 2018-02-04 NOTE — ED Notes (Signed)
ED Provider at bedside. 

## 2018-02-04 NOTE — Discharge Instructions (Addendum)
Follow-up with your primary doctor and continue your medications as previously prescribed.

## 2018-02-04 NOTE — ED Triage Notes (Signed)
Pt c/o abd pains with vomiting for 4-5 days. Denies urinary or bowel problems.

## 2018-03-26 ENCOUNTER — Other Ambulatory Visit: Payer: Self-pay

## 2018-03-26 ENCOUNTER — Encounter (HOSPITAL_COMMUNITY): Payer: Self-pay | Admitting: Emergency Medicine

## 2018-03-26 ENCOUNTER — Emergency Department (HOSPITAL_COMMUNITY)
Admission: EM | Admit: 2018-03-26 | Discharge: 2018-03-26 | Disposition: A | Discharge status: Home / Self Care | Payer: BLUE CROSS/BLUE SHIELD | Attending: Emergency Medicine | Admitting: Emergency Medicine

## 2018-03-26 DIAGNOSIS — Z79899 Other long term (current) drug therapy: Secondary | ICD-10-CM | POA: Insufficient documentation

## 2018-03-26 DIAGNOSIS — R1032 Left lower quadrant pain: Secondary | ICD-10-CM | POA: Diagnosis not present

## 2018-03-26 DIAGNOSIS — D690 Allergic purpura: Secondary | ICD-10-CM | POA: Diagnosis not present

## 2018-03-26 LAB — CBC
HEMATOCRIT: 41.9 % (ref 39.0–52.0)
Hemoglobin: 14.8 g/dL (ref 13.0–17.0)
MCH: 33.1 pg (ref 26.0–34.0)
MCHC: 35.3 g/dL (ref 30.0–36.0)
MCV: 93.7 fL (ref 80.0–100.0)
Platelets: 177 10*3/uL (ref 150–400)
RBC: 4.47 MIL/uL (ref 4.22–5.81)
RDW: 11.9 % (ref 11.5–15.5)
WBC: 9.1 10*3/uL (ref 4.0–10.5)
nRBC: 0 % (ref 0.0–0.2)

## 2018-03-26 LAB — COMPREHENSIVE METABOLIC PANEL
ALT: 19 U/L (ref 0–44)
AST: 19 U/L (ref 15–41)
Albumin: 4.3 g/dL (ref 3.5–5.0)
Alkaline Phosphatase: 61 U/L (ref 38–126)
Anion gap: 7 (ref 5–15)
BUN: 12 mg/dL (ref 6–20)
CHLORIDE: 105 mmol/L (ref 98–111)
CO2: 28 mmol/L (ref 22–32)
Calcium: 8.9 mg/dL (ref 8.9–10.3)
Creatinine, Ser: 1.09 mg/dL (ref 0.61–1.24)
GFR calc Af Amer: >60 mL/min (ref >60)
GFR calc non Af Amer: >60 mL/min (ref >60)
Glucose, Bld: 91 mg/dL (ref 70–99)
Potassium: 3.4 mmol/L — ABNORMAL LOW (ref 3.5–5.1)
Sodium: 140 mmol/L (ref 135–145)
Total Bilirubin: 0.8 mg/dL (ref 0.3–1.2)
Total Protein: 7 g/dL (ref 6.5–8.1)

## 2018-03-26 LAB — LIPASE, BLOOD: Lipase: 55 U/L — ABNORMAL HIGH (ref 11–51)

## 2018-03-26 MED ORDER — SODIUM CHLORIDE 0.9 % IV BOLUS
1000.0000 mL | Freq: Once | INTRAVENOUS | Status: AC
  Administered 2018-03-26: 1000 mL via INTRAVENOUS

## 2018-03-26 MED ORDER — ONDANSETRON HCL 4 MG/2ML IJ SOLN
4.0000 mg | Freq: Once | INTRAMUSCULAR | Status: AC
  Administered 2018-03-26: 4 mg via INTRAVENOUS
  Filled 2018-03-26: qty 2

## 2018-03-26 MED ORDER — KETAMINE HCL 50 MG/5ML IJ SOSY
0.3000 mg/kg | PREFILLED_SYRINGE | Freq: Once | INTRAMUSCULAR | Status: AC
  Administered 2018-03-26: 31 mg via INTRAVENOUS
  Filled 2018-03-26: qty 5

## 2018-03-26 MED ORDER — KETOROLAC TROMETHAMINE 15 MG/ML IJ SOLN
15.0000 mg | Freq: Once | INTRAMUSCULAR | Status: AC
  Administered 2018-03-26: 15 mg via INTRAVENOUS
  Filled 2018-03-26: qty 1

## 2018-03-26 MED ORDER — DICYCLOMINE HCL 20 MG PO TABS: 20.0000 mg | ORAL_TABLET | Freq: Two times a day (BID) | ORAL | 0 refills | Status: DC

## 2018-03-26 MED ORDER — OXYCODONE HCL 5 MG PO TABS
5.0000 mg | ORAL_TABLET | Freq: Once | ORAL | Status: AC
  Administered 2018-03-26: 5 mg via ORAL
  Filled 2018-03-26: qty 1

## 2018-03-26 MED ORDER — ACETAMINOPHEN 500 MG PO TABS: 1000.0000 mg | ORAL_TABLET | Freq: Once | ORAL | Status: DC

## 2018-03-26 MED ORDER — SODIUM CHLORIDE 0.9% FLUSH
3.0000 mL | Freq: Once | INTRAVENOUS | Status: AC
  Administered 2018-03-26: 3 mL via INTRAVENOUS

## 2018-03-26 NOTE — ED Provider Notes (Signed)
[facility]-EMERGENCY DEPT Provider Note   CSN: [id number] Arrival date & time: 03/26/18  5790     History   Chief Complaint Chief Complaint  Patient presents with  . Abdominal Pain    HPI Gregory Conway is a 23 y.o. male.  HPI   Reports hx of abdominal pain since having HSP in 2016, reports it is normally under control. Reports for the last 3 days it has been severe pain.  LLQ. Nothing makes it better or worse.  Reports when it is severe will take pain medication, but main medication is gabapentin, cymbalta and tylenol. Taking 500mg  once to twice a week. Has not taken any ibuprofen.  Seeing GI in March for colonoscopy. Following up with rhuematology.  PCP follow up in February.   Reports pain so severe threw up. No diarrhea. No dysuria. No fever.  No nausea otherwise. Last time took narcotics was 1/20, does not think he had withdrawal.    Past Medical History:  Diagnosis Date  . Acne   . ADHD   . GERD (gastroesophageal reflux disease)   . HSP (Henoch Schonlein purpura) [facility])     Patient Active Problem List   Diagnosis Date Noted  . ADD (attention deficit disorder) 01/03/2018    History reviewed. No pertinent surgical history.      Home Medications    Prior to Admission medications   Medication Sig Start Date End Date Taking? Authorizing Provider  acetaminophen (TYLENOL) 500 MG tablet Take 500 mg by mouth every 6 (six) hours as needed for mild pain, fever or headache.   Yes [provider]  dexmethylphenidate (FOCALIN) 10 MG tablet Take 10 mg by mouth daily as needed (mainly uses to help during school).  01/12/18  Yes [provider]  DULoxetine (CYMBALTA) 30 MG capsule Take 90 mg by mouth daily.  06/22/17  Yes [provider]  gabapentin (NEURONTIN) 300 MG capsule Take 300 mg by mouth 3 (three) times daily.  07/28/17  Yes [provider]  HUMIRA PEN 40 MG/0.4ML PNKT Inject 40 mg into the skin once a week.   12/05/16  Yes [provider]  linaclotide (LINZESS) 72 MCG capsule Take 72 mcg by mouth daily as needed (constipation).  03/13/17  Yes [provider]  Multiple Vitamin (MULTIVITAMIN WITH MINERALS) TABS tablet Take 1 tablet by mouth daily.   Yes [provider]  Multiple Vitamins-Minerals (MULTIVITAMIN ADULT) TABS Take 1 tablet by mouth daily.    Yes [provider]  oxyCODONE-acetaminophen (PERCOCET) 10-325 MG tablet Take 1 tablet by mouth every 6 (six) hours as needed for pain. 02/25/18  Yes [provider]    Family History Family History  Problem Relation Age of Onset  . Lupus Mother   . Hypertension Father     Social History Social History   Tobacco Use  . Smoking status: Never Smoker  . Smokeless tobacco: Never Used  Substance Use Topics  . Alcohol use: Yes    Comment: rarely  . Drug use: No     Allergies   Haloperidol; Morphine and related; Penicillins; and Tramadol   Review of Systems Review of Systems  Constitutional: Negative for fever.  HENT: Negative for sore throat.   Eyes: Negative for visual disturbance.  Respiratory: Negative for shortness of breath.   Cardiovascular: Negative for chest pain.  Gastrointestinal: Positive for abdominal pain. Negative for nausea and vomiting.  Genitourinary: Negative for difficulty urinating.  Musculoskeletal: Negative for back pain and neck  stiffness.  Skin: Negative for rash.  Neurological: Negative for syncope and headaches.     Physical Exam Updated Vital Signs BP (!) 142/84   Pulse (!) 101   Temp 97.7 F (36.5 C) (Oral)   Resp 15   Ht 6\' 1"  (1.854 m)   Wt 102 kg   SpO2 100%   BMI 29.67 kg/m   Physical Exam Vitals signs and nursing note reviewed.  Constitutional:      General: He is not in acute distress.    Appearance: He is well-developed. He is not diaphoretic.  HENT:     Head: Normocephalic and atraumatic.  Eyes:     Conjunctiva/sclera: Conjunctivae  normal.  Neck:     Musculoskeletal: Normal range of motion.  Cardiovascular:     Rate and Rhythm: Normal rate and regular rhythm.     Heart sounds: Normal heart sounds. No murmur. No friction rub. No gallop.   Pulmonary:     Effort: Pulmonary effort is normal. No respiratory distress.     Breath sounds: Normal breath sounds. No wheezing or rales.  Abdominal:     General: There is no distension.     Palpations: Abdomen is soft.     Tenderness: There is abdominal tenderness in the left lower quadrant. There is no right CVA tenderness, left CVA tenderness or guarding.  Skin:    General: Skin is warm and dry.  Neurological:     Mental Status: He is alert and oriented to person, place, and time.      ED Treatments / Results  Labs (all labs ordered are listed, but only abnormal results are displayed) Labs Reviewed  LIPASE, BLOOD - Abnormal; Notable for the following components:      Result Value   Lipase 55 (*)    All other components within normal limits  COMPREHENSIVE METABOLIC PANEL - Abnormal; Notable for the following components:   Potassium 3.4 (*)    All other components within normal limits  CBC  URINALYSIS, ROUTINE W REFLEX MICROSCOPIC    EKG None  Radiology No results found.  Procedures Procedures (including critical care time)  Medications Ordered in ED Medications  oxyCODONE (Oxy IR/ROXICODONE) immediate release tablet 5 mg (has no administration in time range)  ondansetron (ZOFRAN) injection 4 mg (has no administration in time range)  ketorolac (TORADOL) 15 MG/ML injection 15 mg (has no administration in time range)  ketamine 50 mg in normal saline 5 mL (10 mg/mL) syringe (has no administration in time range)  sodium chloride 0.9 % bolus 1,000 mL (has no administration in time range)  sodium chloride flush (NS) 0.9 % injection 3 mL (3 mLs Intravenous Given 03/26/18 0800)     Initial Impression / Assessment and Plan / ED Course  I have reviewed the triage  vital signs and the nursing notes.  Pertinent labs & imaging results that were available during my care of the patient were reviewed by me and considered in my medical decision making (see chart for details).    23 year old male with history of ankylosing spondylitis, ADD, anxiety, HSP, chronic left lower quadrant abdominal pain for which [facility], with findings showing mild antral gastritis, duodenal inflammation, multiple aphthous ulcers in the distal rectum and proximal sigmoid colon duodenal erythema, with extensive work-up including multiple colonoscopies, EGD, MR angiograms, capsule endoscopy, CT scans, CT enterography, with most recent gastroenterology [facility] indicating concern for  likely functional abdominal pain, with continuing work up for possible inflammatory bowel disease, who presents with concern for LLQ abdominal pain.  He is currently off of prednisone and azathioprine.  His abdominal exam is benign, no diarrhea, no fever, doubt abscess, diverticulitis, obstruction and do not feel CT imaging is indicated at this time in setting of acute on chronic abdominal pain, prior negative CTs for similar. Labs obtained in triage show no acute findings. Mild hypokalemia, mildly elevated lipase but not consistent with pancreatitis and clinical exam and history not consistent with this. CBC WNL.  Given IV fluids for decreased po intake.   Given oxycodone 5mg , ketamine, toradol for acute on chronic abdominal pain in the ED. Will discharge with rx for bentyl and recommend close follow up with PCP, GI, rheumatology and recommend consultation with pain management.    Final Clinical Impressions(s) / ED Diagnoses   Final diagnoses:  Left lower quadrant abdominal pain    ED Discharge Orders    None       Alvira MondaySchlossman, Wyoma Genson, MD 03/26/18 [id number]

## 2018-03-26 NOTE — ED Triage Notes (Signed)
Per pt states LLQ pain that started 2 days ago-no N/V/D

## 2018-03-26 NOTE — ED Notes (Signed)
Unable to obtain signature upon discharge, wife coming to get pt from lobby. Pt ambulatory, A/Ox4.

## 2018-08-10 ENCOUNTER — Other Ambulatory Visit: Payer: Self-pay

## 2018-08-10 ENCOUNTER — Emergency Department (HOSPITAL_COMMUNITY)
Admission: EM | Admit: 2018-08-10 | Discharge: 2018-08-10 | Disposition: A | Discharge status: Home / Self Care | Payer: BLUE CROSS/BLUE SHIELD | Attending: Emergency Medicine | Admitting: Emergency Medicine

## 2018-08-10 ENCOUNTER — Encounter (HOSPITAL_COMMUNITY): Payer: Self-pay

## 2018-08-10 DIAGNOSIS — R1032 Left lower quadrant pain: Secondary | ICD-10-CM | POA: Insufficient documentation

## 2018-08-10 DIAGNOSIS — Z79899 Other long term (current) drug therapy: Secondary | ICD-10-CM | POA: Insufficient documentation

## 2018-08-10 DIAGNOSIS — R1030 Lower abdominal pain, unspecified: Secondary | ICD-10-CM

## 2018-08-10 LAB — URINALYSIS, ROUTINE W REFLEX MICROSCOPIC
Bilirubin Urine: NEGATIVE
Glucose, UA: NEGATIVE mg/dL
Hgb urine dipstick: NEGATIVE
Ketones, ur: NEGATIVE mg/dL
Leukocytes,Ua: NEGATIVE
Nitrite: NEGATIVE
Protein, ur: NEGATIVE mg/dL
Specific Gravity, Urine: 1.012 (ref 1.005–1.030)
pH: 6 (ref 5.0–8.0)

## 2018-08-10 LAB — CBC WITH DIFFERENTIAL/PLATELET
Abs Immature Granulocytes: 0.04 10*3/uL (ref 0.00–0.07)
Basophils Absolute: 0 10*3/uL (ref 0.0–0.1)
Basophils Relative: 0 %
Eosinophils Absolute: 0.2 10*3/uL (ref 0.0–0.5)
Eosinophils Relative: 2 %
HCT: 41.2 % (ref 39.0–52.0)
Hemoglobin: 15.1 g/dL (ref 13.0–17.0)
Immature Granulocytes: 0 %
Lymphocytes Relative: 25 %
Lymphs Abs: 2.7 10*3/uL (ref 0.7–4.0)
MCH: 33 pg (ref 26.0–34.0)
MCHC: 36.7 g/dL — ABNORMAL HIGH (ref 30.0–36.0)
MCV: 90.2 fL (ref 80.0–100.0)
Monocytes Absolute: 0.7 10*3/uL (ref 0.1–1.0)
Monocytes Relative: 6 %
Neutro Abs: 7 10*3/uL (ref 1.7–7.7)
Neutrophils Relative %: 67 %
Platelets: 172 10*3/uL (ref 150–400)
RBC: 4.57 MIL/uL (ref 4.22–5.81)
RDW: 12.1 % (ref 11.5–15.5)
WBC: 10.5 10*3/uL (ref 4.0–10.5)
nRBC: 0 % (ref 0.0–0.2)

## 2018-08-10 LAB — HEPATIC FUNCTION PANEL
ALT: 20 U/L (ref 0–44)
AST: 18 U/L (ref 15–41)
Albumin: 4.2 g/dL (ref 3.5–5.0)
Alkaline Phosphatase: 62 U/L (ref 38–126)
Bilirubin, Direct: 0.2 mg/dL (ref 0.0–0.2)
Indirect Bilirubin: 0.8 mg/dL (ref 0.3–0.9)
Total Bilirubin: 1 mg/dL (ref 0.3–1.2)
Total Protein: 7 g/dL (ref 6.5–8.1)

## 2018-08-10 LAB — BASIC METABOLIC PANEL
Anion gap: 11 (ref 5–15)
BUN: 10 mg/dL (ref 6–20)
CO2: 26 mmol/L (ref 22–32)
Calcium: 8.9 mg/dL (ref 8.9–10.3)
Chloride: 103 mmol/L (ref 98–111)
Creatinine, Ser: 1.08 mg/dL (ref 0.61–1.24)
GFR calc Af Amer: >60 mL/min (ref >60)
GFR calc non Af Amer: >60 mL/min (ref >60)
Glucose, Bld: 88 mg/dL (ref 70–99)
Potassium: 3.3 mmol/L — ABNORMAL LOW (ref 3.5–5.1)
Sodium: 140 mmol/L (ref 135–145)

## 2018-08-10 LAB — LIPASE, BLOOD: Lipase: 39 U/L (ref 11–51)

## 2018-08-10 MED ORDER — HYDROMORPHONE HCL 1 MG/ML IJ SOLN
1.0000 mg | Freq: Once | INTRAMUSCULAR | Status: AC
  Administered 2018-08-10: 1 mg via INTRAVENOUS
  Filled 2018-08-10: qty 1

## 2018-08-10 MED ORDER — HYDROMORPHONE HCL 1 MG/ML IJ SOLN
0.5000 mg | Freq: Once | INTRAMUSCULAR | Status: AC
  Administered 2018-08-10: 0.5 mg via INTRAVENOUS
  Filled 2018-08-10: qty 1

## 2018-08-10 MED ORDER — DICYCLOMINE HCL 10 MG PO CAPS
10.0000 mg | ORAL_CAPSULE | Freq: Once | ORAL | Status: AC
  Administered 2018-08-10: 10 mg via ORAL
  Filled 2018-08-10: qty 1

## 2018-08-10 MED ORDER — SODIUM CHLORIDE 0.9 % IV BOLUS
1000.0000 mL | Freq: Once | INTRAVENOUS | Status: AC
  Administered 2018-08-10: 1000 mL via INTRAVENOUS

## 2018-08-10 MED ORDER — ONDANSETRON HCL 4 MG PO TABS: 4.0000 mg | ORAL_TABLET | Freq: Four times a day (QID) | ORAL | 0 refills | Status: AC

## 2018-08-10 MED ORDER — ONDANSETRON HCL 4 MG/2ML IJ SOLN
4.0000 mg | Freq: Once | INTRAMUSCULAR | Status: AC
  Administered 2018-08-10: 4 mg via INTRAVENOUS
  Filled 2018-08-10: qty 2

## 2018-08-10 MED ORDER — HYDROMORPHONE HCL 1 MG/ML IJ SOLN: 0.5000 mg | Freq: Once | INTRAMUSCULAR | Status: DC

## 2018-08-10 NOTE — ED Provider Notes (Signed)
[facility] DEPT Provider Note   CSN: [id number] Arrival date & time: 08/10/18  0603    History   Chief Complaint Chief Complaint  Patient presents with   Abdominal Pain    HPI Gregory Conway is a 23 y.o. male.     23 y.o male with a PMH of HSP, IBS presents to the ED with a chief complaint of abdominal pain x 3-4 days ago. Patient describes this as a constant stabbing in the left lower quadrant with no radiation. Reports the pain is 10/10, severe enough "that it makes me vomit" and feel "short of breath". He reports one episode of vomiting, non bloody, non bilious. No medications taken prior to arrival.Last bowel movement was yesterday, normal in nature. Denies any surgical history, fever, urinary symptoms or chest pain.  Of note, after reviewing patient's record he had a colonoscopy on 07/23/2018 with normal findings.      Past Medical History:  Diagnosis Date   Acne    ADHD    GERD (gastroesophageal reflux disease)    HSP (Henoch Schonlein purpura) (HCC)     Patient Active Problem List   Diagnosis Date Noted   ADD (attention deficit disorder) 01/03/2018    History reviewed. No pertinent surgical history.      Home Medications    Prior to Admission medications   Medication Sig Start Date End Date Taking? Authorizing Provider  acetaminophen (TYLENOL) 500 MG tablet Take 500 mg by mouth every 6 (six) hours as needed for mild pain, fever or headache.   Yes [provider]  dexmethylphenidate (FOCALIN) 10 MG tablet Take 10 mg by mouth daily as needed (mainly uses to help during school).  01/12/18  Yes [provider]  dicyclomine (BENTYL) 20 MG tablet Take 1 tablet (20 mg total) by mouth 2 (two) times daily. 03/26/18  Yes Gareth Morgan, MD  gabapentin (NEURONTIN) 300 MG capsule Take 300 mg by mouth 3 (three) times daily.  07/28/17  Yes [provider]  HUMIRA PEN 40 MG/0.4ML PNKT Inject 40 mg into the  skin every Tuesday.  12/05/16  Yes [provider]  linaclotide (LINZESS) 72 MCG capsule Take 72 mcg by mouth daily as needed (constipation).  03/13/17  Yes [provider]  Multiple Vitamin (MULTIVITAMIN WITH MINERALS) TABS tablet Take 1 tablet by mouth daily.   Yes [provider]  oxyCODONE-acetaminophen (PERCOCET) 10-325 MG tablet Take 1 tablet by mouth every 6 (six) hours as needed for pain. 02/25/18  Yes [provider]  ondansetron (ZOFRAN) 4 MG tablet Take 1 tablet (4 mg total) by mouth every 6 (six) hours for 7 days. 08/10/18 08/17/18  Janeece Fitting, PA-C    Family History Family History  Problem Relation Age of Onset   Lupus Mother    Hypertension Father     Social History Social History   Tobacco Use   Smoking status: Never Smoker   Smokeless tobacco: Never Used  Substance Use Topics   Alcohol use: Yes    Comment: rarely   Drug use: No     Allergies   Haloperidol, Morphine and related, Penicillins, and Tramadol   Review of Systems Review of Systems  Constitutional: Negative for chills and fever.  HENT: Negative for ear pain and sore throat.   Eyes: Negative for pain and visual disturbance.  Respiratory: Negative for cough and shortness of breath.   Cardiovascular: Negative for chest pain and palpitations.  Gastrointestinal: Positive for abdominal pain, nausea and  vomiting.  Genitourinary: Negative for dysuria and hematuria.  Musculoskeletal: Negative for arthralgias and back pain.  Skin: Negative for color change and rash.  Neurological: Negative for seizures and syncope.  All other systems reviewed and are negative.    Physical Exam Updated Vital Signs BP 127/78    Pulse 84    Temp 98.5 F (36.9 C) (Oral)    Resp 10    Ht 6' (1.829 m)    SpO2 98%    BMI 30.50 kg/m   Physical Exam Vitals signs and nursing note reviewed.  Constitutional:      Appearance: He is well-developed. He is not ill-appearing.     Comments:  Appears uncomfortable throughout exam.   HENT:     Head: Normocephalic and atraumatic.  Eyes:     General: No scleral icterus.    Pupils: Pupils are equal, round, and reactive to light.  Neck:     Musculoskeletal: Normal range of motion.  Cardiovascular:     Heart sounds: Normal heart sounds.  Pulmonary:     Effort: Pulmonary effort is normal.     Breath sounds: Normal breath sounds. No wheezing.  Chest:     Chest wall: No tenderness.  Abdominal:     General: Bowel sounds are increased. There is no distension.     Palpations: Abdomen is soft.     Tenderness: There is abdominal tenderness in the right lower quadrant, suprapubic area and left lower quadrant. There is guarding. There is no right CVA tenderness, left CVA tenderness or rebound. Negative signs include Rovsing's sign.  Musculoskeletal:        General: No tenderness or deformity.  Skin:    General: Skin is warm and dry.  Neurological:     Mental Status: He is alert and oriented to person, place, and time.      ED Treatments / Results  Labs (all labs ordered are listed, but only abnormal results are displayed) Labs Reviewed  CBC WITH DIFFERENTIAL/PLATELET - Abnormal; Notable for the following components:      Result Value   MCHC 36.7 (*)    All other components within normal limits  BASIC METABOLIC PANEL - Abnormal; Notable for the following components:   Potassium 3.3 (*)    All other components within normal limits  URINALYSIS, ROUTINE W REFLEX MICROSCOPIC  LIPASE, BLOOD  HEPATIC FUNCTION PANEL    EKG None  Radiology No results found.  Procedures Procedures (including critical care time)  Medications Ordered in ED Medications  HYDROmorphone (DILAUDID) injection 1 mg (has no administration in time range)  sodium chloride 0.9 % bolus 1,000 mL (0 mLs Intravenous Stopped 08/10/18 0934)  HYDROmorphone (DILAUDID) injection 0.5 mg (0.5 mg Intravenous Given 08/10/18 0757)  ondansetron (ZOFRAN) injection 4 mg  (4 mg Intravenous Given 08/10/18 1011)  HYDROmorphone (DILAUDID) injection 0.5 mg (0.5 mg Intravenous Given 08/10/18 1010)  dicyclomine (BENTYL) capsule 10 mg (10 mg Oral Given 08/10/18 1204)     Initial Impression / Assessment and Plan / ED Course  I have reviewed the triage vital signs and the nursing notes.  Pertinent labs & imaging results that were available during my care of the patient were reviewed by me and considered in my medical decision making (see chart for details).    Patient with a PMH of Ankylosing spondylitis presents to the ED with a chief complaint of abdominal pain x 3-4 days mostly located on the LLQ without radiation. 1 episodes of emesis, no diarrhea, no blood in  stools or emesis. Afebrile. During primary evaluation patient appears uncomfortable reports his pain is 10/10, voices allergy to morphine and states medications that work for his pain is dilaudid.   BMP showed slight decrease in potassium however stable.  No other electrolyte abnormality.  Creatinine level is normal.  CBC was unremarkable.  Hepatic function showed no abnormalities, LFTs unremarkable.  Lipase is normal.  Shared decision making with patient who recently had a colonoscopy on 07/23/2018 which showed no abnormalities.  His last CT was from 2018 which was normal.  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  His pain has improved significantly after Dilaudid along with a liter of fluids.  No emesis while in the ED.  Patient is requesting a second round of pain medication will provide him with more pain medication along with Zofran.  He also requested a second liter of fluids, advised him that do not believe this is needed at this time as his labs are well-appearing, he has had no diarrhea, one episode of emesis, according to his labs not noted to be dehydrated.  Will obtain urine sample prior to disposition along with p.o. challenge.  1:04 PM UA showed no nitrites, leukocytes, no urinary  symptoms. Patient was given bentyl which he reports did not help with his pain.  1:17 PM Patient is requesting admission at this time, I have informed him that I don't have any clinical reason for keeping him in the hospital as the risks can outweigh the admission. He is requesting more dilaudid, will provide him with 1 mg of Dilaudid prior to discharge.  I have extensively [facility], he has a prior prescription for oxycodone on May 13 for this pain.  Patient had a normal colonoscopy 2 weeks ago, he does have follow-up with them within 4 months.  At this time I believe this is likely a chronic complaint.  I offered a second time a CT abdomen and pelvis for patient, he reports "no I know this is just my regular pain, I know is going to be normal ".  Because patient has no fever, bleeding, has normal stools, has not had any vomiting episodes while in the ED I do not feel further work-up is warranted at this time.  Patient understands and agrees with management.  Encouraged to follow-up with PCP and establish further management of his IBS with gastroenterology.  Return precautions provided.   Portions of this note were generated with Scientist, clinical (histocompatibility and immunogenetics)Dragon dictation software. Dictation errors may occur despite best attempts at proofreading.   Final Clinical Impressions(s) / ED Diagnoses   Final diagnoses:  Lower abdominal pain    ED Discharge Orders         Ordered    ondansetron (ZOFRAN) 4 MG tablet  Every 6 hours     08/10/18 1316           Claude MangesSoto, Amarri Michaelson, PA-C 08/10/18 1322    Virgina NorfolkCuratolo, Adam, DO 08/10/18 1514

## 2018-08-10 NOTE — ED Triage Notes (Signed)
Pt arrived with complaints of LLQ abdominal pain that started 3-4 days ago. Pt states he vomited once in the last 24 hours but stating it was due to the pain. Pt denying any other symptoms at this time.

## 2018-08-10 NOTE — Discharge Instructions (Addendum)
Your laboratory results were normal today. I have provided a prescription for zofran, please take this as needed for your nausea. Please follow up with your primary care as needed.

## 2018-08-17 ENCOUNTER — Encounter (HOSPITAL_COMMUNITY): Payer: Self-pay

## 2018-08-17 ENCOUNTER — Emergency Department (HOSPITAL_COMMUNITY)
Admission: EM | Admit: 2018-08-17 | Discharge: 2018-08-17 | Disposition: A | Discharge status: Home / Self Care | Payer: BLUE CROSS/BLUE SHIELD | Attending: Emergency Medicine | Admitting: Emergency Medicine

## 2018-08-17 ENCOUNTER — Other Ambulatory Visit: Payer: Self-pay

## 2018-08-17 DIAGNOSIS — R109 Unspecified abdominal pain: Secondary | ICD-10-CM | POA: Diagnosis not present

## 2018-08-17 DIAGNOSIS — Z5321 Procedure and treatment not carried out due to patient leaving prior to being seen by health care provider: Secondary | ICD-10-CM | POA: Diagnosis not present

## 2018-08-17 DIAGNOSIS — G8929 Other chronic pain: Secondary | ICD-10-CM | POA: Insufficient documentation

## 2018-08-17 NOTE — ED Notes (Addendum)
Registration came to this nurse stating patient gave his stickers back to them stating he was leaving. Pt made aware of risks of leaving.

## 2018-08-17 NOTE — ED Triage Notes (Signed)
Pt arrived complaining of left lower quadrant pain, pt seen on 6/16 for the same and states nothing has changed since previous seen. Reports some vomiting but states he feels it is due to the pain, last bm yesterday and was normal.

## 2019-01-03 ENCOUNTER — Encounter (HOSPITAL_COMMUNITY): Payer: Self-pay | Admitting: Emergency Medicine

## 2019-01-03 ENCOUNTER — Other Ambulatory Visit: Payer: Self-pay

## 2019-01-03 ENCOUNTER — Emergency Department (HOSPITAL_COMMUNITY)
Admission: EM | Admit: 2019-01-03 | Discharge: 2019-01-03 | Discharge status: Against Medical Advice | Payer: BLUE CROSS/BLUE SHIELD | Attending: Emergency Medicine | Admitting: Emergency Medicine

## 2019-01-03 DIAGNOSIS — Z5320 Procedure and treatment not carried out because of patient's decision for unspecified reasons: Secondary | ICD-10-CM | POA: Insufficient documentation

## 2019-01-03 DIAGNOSIS — Z79899 Other long term (current) drug therapy: Secondary | ICD-10-CM | POA: Insufficient documentation

## 2019-01-03 DIAGNOSIS — Z765 Malingerer [conscious simulation]: Secondary | ICD-10-CM | POA: Diagnosis not present

## 2019-01-03 DIAGNOSIS — R1032 Left lower quadrant pain: Secondary | ICD-10-CM | POA: Diagnosis present

## 2019-01-03 DIAGNOSIS — G8929 Other chronic pain: Secondary | ICD-10-CM | POA: Diagnosis not present

## 2019-01-03 LAB — COMPREHENSIVE METABOLIC PANEL
ALT: 17 U/L (ref 0–44)
AST: 17 U/L (ref 15–41)
Albumin: 4.3 g/dL (ref 3.5–5.0)
Alkaline Phosphatase: 67 U/L (ref 38–126)
Anion gap: 7 (ref 5–15)
BUN: 8 mg/dL (ref 6–20)
CO2: 25 mmol/L (ref 22–32)
Calcium: 8.7 mg/dL — ABNORMAL LOW (ref 8.9–10.3)
Chloride: 107 mmol/L (ref 98–111)
Creatinine, Ser: 1.18 mg/dL (ref 0.61–1.24)
GFR calc Af Amer: >60 mL/min (ref >60)
GFR calc non Af Amer: >60 mL/min (ref >60)
Glucose, Bld: 94 mg/dL (ref 70–99)
Potassium: 3.5 mmol/L (ref 3.5–5.1)
Sodium: 139 mmol/L (ref 135–145)
Total Bilirubin: 0.7 mg/dL (ref 0.3–1.2)
Total Protein: 7.2 g/dL (ref 6.5–8.1)

## 2019-01-03 LAB — CBC
HCT: 44.1 % (ref 39.0–52.0)
Hemoglobin: 15.6 g/dL (ref 13.0–17.0)
MCH: 32 pg (ref 26.0–34.0)
MCHC: 35.4 g/dL (ref 30.0–36.0)
MCV: 90.6 fL (ref 80.0–100.0)
Platelets: 207 10*3/uL (ref 150–400)
RBC: 4.87 MIL/uL (ref 4.22–5.81)
RDW: 11.8 % (ref 11.5–15.5)
WBC: 8.9 10*3/uL (ref 4.0–10.5)
nRBC: 0 % (ref 0.0–0.2)

## 2019-01-03 LAB — LIPASE, BLOOD: Lipase: 47 U/L (ref 11–51)

## 2019-01-03 MED ORDER — KETOROLAC TROMETHAMINE 30 MG/ML IJ SOLN
30.0000 mg | Freq: Once | INTRAMUSCULAR | Status: AC
  Administered 2019-01-03: 30 mg via INTRAVENOUS
  Filled 2019-01-03: qty 1

## 2019-01-03 MED ORDER — SODIUM CHLORIDE 0.9% FLUSH: 3.0000 mL | Freq: Once | INTRAVENOUS | Status: DC

## 2019-01-03 MED ORDER — SODIUM CHLORIDE 0.9 % IV BOLUS
500.0000 mL | Freq: Once | INTRAVENOUS | Status: AC
  Administered 2019-01-03: 500 mL via INTRAVENOUS

## 2019-01-03 MED ORDER — HYDROMORPHONE HCL 1 MG/ML IJ SOLN
1.0000 mg | Freq: Once | INTRAMUSCULAR | Status: AC
  Administered 2019-01-03: 1 mg via INTRAVENOUS
  Filled 2019-01-03: qty 1

## 2019-01-03 MED ORDER — HYOSCYAMINE SULFATE 0.125 MG SL SUBL
0.2500 mg | SUBLINGUAL_TABLET | Freq: Once | SUBLINGUAL | Status: AC
  Administered 2019-01-03: 06:00:00 0.25 mg via ORAL
  Filled 2019-01-03: qty 2

## 2019-01-03 MED ORDER — ONDANSETRON HCL 4 MG/2ML IJ SOLN
4.0000 mg | Freq: Once | INTRAMUSCULAR | Status: AC
  Administered 2019-01-03: 4 mg via INTRAVENOUS
  Filled 2019-01-03: qty 2

## 2019-01-03 NOTE — ED Provider Notes (Cosign Needed)
  Physical Exam  BP 125/85   Pulse 67   Temp 98 F (36.7 C) (Oral)   Resp (!) 28   Ht 6\' 1"  (1.854 m)   Wt 99.8 kg   SpO2 100%   BMI 29.03 kg/m   Physical Exam  ED Course/Procedures   Clinical Course as of Jan 03 832  [facility] Jan 03, 2019  0521 He reports no improvement after Dilaudid.  He asked for multiple additional doses of Dilaudid and ketamine.   [HM]    Clinical Course User Index [HM] Muthersbaugh, Gwenlyn Perking    Procedures  MDM  Patient care assumed from [facility] M.PA at shift change, please see her note for full HPI.  Briefly, patient here with chronic abdominal pain, prior history of HSP, IBS presents to the ED with complaints of abdominal pain.  Reports this is his usual pain.  Patient has had a colonoscopy in May 29 of 2020 with normal findings, he is currently been seen by [facility] along with [facility].  According to the GI note from September 16, 2018, patient's records indicate he has had left lower quadrant abdominal pain that only responds to narcotics.  Colleague had offer CAT scan although he denied as he felt like this is his usual pain.  He reports "my pain only responds to Dilaudid and ketamine " per nursing staff. Abdominal exam is benign.  He did receive 1 round of Dilaudid by my colleague, along with Toradol to help with his pain. According to nursing notes patient has been able to tolerate p.o. without any vomiting.  Plan is to follow-up on urine.  8:33 AM patient reporting to nursing staff that he cannot provide urine as he has not been given his ketamine and Dilaudid.  He has drank a full Sprite without any nausea or vomiting.  Patient states "if you are not going to give me narcotics I will just go home and be miserable at home ".  Patient has now opted to leave [place]. Unfortunately, I am unable to further evaluate patient's abdominal pain as he did not provide a urine sample and I cannot rule out further emergencies.   Portions of this note  were generated with Lobbyist. Dictation errors may occur despite best attempts at proofreading.         Janeece Fitting, PA-C 01/03/19 [id number]

## 2019-01-03 NOTE — ED Notes (Signed)
Pt able to tolerate sprite, However pt did say he did have slight nausea  from pain.

## 2019-01-03 NOTE — ED Notes (Signed)
Pt reminded of the need of urine sample. Urinal at bedside

## 2019-01-03 NOTE — ED Triage Notes (Signed)
Pt reports LLQ pain that started 3 days ago. Pt also having nausea and vomiting from pain.

## 2019-01-03 NOTE — ED Provider Notes (Signed)
[facility] DEPT Provider Note   CSN: [id number] Arrival date & time: 01/03/19  0218     History   Chief Complaint Chief Complaint  Patient presents with  . Abdominal Pain    HPI Gregory Conway is a 23 y.o. male with a hx of HSP, IBS presents to the Emergency Department complaining of gradual, persistent, progressively worsening left lower quadrant abdominal pain onset 3-4 days ago.  Patient reports this is a constant and stabbing pain.  He denies radiation.  Reports the pain is 10/10.  He has associated nausea and vomiting but denies diarrhea.  Last bowel movement was yesterday and normal.  Patient reports emesis is nonbloody nonbilious.  No melena or hematochezia.  No medications prior to arrival.  Patient denies history of abdominal surgeries.  He denies fever, chills, urinary symptoms, chest pain, shortness of breath.  Records reviewed.  Patient had colonoscopy on 07/23/2018 with normal findings.  Patient established with Dr. Glennon Hamilton at [facility] gastroenterology.  In the last year, patient has seen [facility] and [facility].  Patient has a long GI history including previous biopsies which showed acute colitis but no chronic colitis.  CT scans and MRIs without acute abnormalities. Intermittent constipation.  GI note from 09/16/2018 records patient statements of ongoing left lower quadrant abdominal pain that only responds to narcotics.  He has had extensive evaluation at 3 separate tertiary medical centers and has had a normal colonoscopy.  He is supposed to be taking hyoscyamine for abdominal pain and avoiding narcotics.   Patient evaluated here in the emergency department in June 2020.  He declined CT scan at that time. He had multiple doses of narcotics without improvement at that time.     The history is provided by the patient and medical records. No language interpreter was used.    Past Medical History:  Diagnosis Date  . Acne   . ADHD   . GERD  (gastroesophageal reflux disease)   . HSP (Henoch Schonlein purpura) [facility])     Patient Active Problem List   Diagnosis Date Noted  . ADD (attention deficit disorder) 01/03/2018    History reviewed. No pertinent surgical history.      Home Medications    Prior to Admission medications   Medication Sig Start Date End Date Taking? Authorizing Provider  HUMIRA PEN 40 MG/0.4ML PNKT Inject 40 mg into the skin every Sunday.  12/05/16  Yes [provider]  Multiple Vitamin (MULTIVITAMIN WITH MINERALS) TABS tablet Take 1 tablet by mouth daily.   Yes [provider]    Family History Family History  Problem Relation Age of Onset  . Lupus Mother   . Hypertension Father     Social History Social History   Tobacco Use  . Smoking status: Never Smoker  . Smokeless tobacco: Never Used  Substance Use Topics  . Alcohol use: Yes    Comment: rarely  . Drug use: No     Allergies   Haloperidol, Morphine and related, Penicillins, and Tramadol   Review of Systems Review of Systems  Constitutional: Negative for appetite change, diaphoresis, fatigue, fever and unexpected weight change.  HENT: Negative for mouth sores.   Eyes: Negative for visual disturbance.  Respiratory: Negative for cough, chest tightness, shortness of breath and wheezing.   Cardiovascular: Negative for chest pain.  Gastrointestinal: Positive for abdominal pain, nausea and vomiting. Negative for constipation and diarrhea.  Endocrine: Negative for polydipsia, polyphagia and polyuria.  Genitourinary: Negative for dysuria, frequency,  hematuria and urgency.  Musculoskeletal: Negative for back pain and neck stiffness.  Skin: Negative for rash.  Allergic/Immunologic: Negative for immunocompromised state.  Neurological: Negative for syncope, light-headedness and headaches.  Hematological: Does not bruise/bleed easily.  Psychiatric/Behavioral: Negative for sleep disturbance. The patient is not  nervous/anxious.      Physical Exam Updated Vital Signs BP 133/81   Pulse 66   Temp 98 F (36.7 C) (Oral)   Resp 11   Ht 6\' 1"  (1.854 m)   Wt 99.8 kg   SpO2 100%   BMI 29.03 kg/m   Physical Exam Vitals signs and nursing note reviewed.  Constitutional:      General: He is not in acute distress.    Appearance: He is not diaphoretic.  HENT:     Head: Normocephalic.  Eyes:     General: No scleral icterus.    Conjunctiva/sclera: Conjunctivae normal.  Neck:     Musculoskeletal: Normal range of motion.  Cardiovascular:     Rate and Rhythm: Normal rate and regular rhythm.     Pulses: Normal pulses.          Radial pulses are 2+ on the right side and 2+ on the left side.  Pulmonary:     Effort: No tachypnea, accessory muscle usage, prolonged expiration, respiratory distress or retractions.     Breath sounds: No stridor.     Comments: Equal chest rise. No increased work of breathing. Abdominal:     General: There is no distension.     Palpations: Abdomen is soft.     Tenderness: There is abdominal tenderness in the left lower quadrant. There is guarding. There is no right CVA tenderness, left CVA tenderness or rebound.     Hernia: No hernia is present.  Musculoskeletal:     Comments: Moves all extremities equally and without difficulty. No peripheral edema.  No pretibial edema.  Skin:    General: Skin is warm and dry.     Capillary Refill: Capillary refill takes less than 2 seconds.     Comments: No petechiae.  No purpura.  Neurological:     Mental Status: He is alert.     GCS: GCS eye subscore is 4. GCS verbal subscore is 5. GCS motor subscore is 6.     Comments: Speech is clear and goal oriented.  Psychiatric:        Mood and Affect: Mood normal.      ED Treatments / Results  Labs (all labs ordered are listed, but only abnormal results are displayed) Labs Reviewed  COMPREHENSIVE METABOLIC PANEL - Abnormal; Notable for the following components:      Result  Value   Calcium 8.7 (*)    All other components within normal limits  LIPASE, BLOOD  CBC  URINALYSIS, ROUTINE W REFLEX MICROSCOPIC     Procedures Procedures (including critical care time)  Medications Ordered in ED Medications  sodium chloride flush (NS) 0.9 % injection 3 mL (has no administration in time range)  HYDROmorphone (DILAUDID) injection 1 mg (1 mg Intravenous Given 01/03/19 0406)  ondansetron (ZOFRAN) injection 4 mg (4 mg Intravenous Given 01/03/19 0406)  sodium chloride 0.9 % bolus 500 mL (500 mLs Intravenous New Bag/Given 01/03/19 0409)  ketorolac (TORADOL) 30 MG/ML injection 30 mg (30 mg Intravenous Given 01/03/19 0518)  hyoscyamine (LEVSIN SL) SL tablet 0.25 mg (0.25 mg Oral Given 01/03/19 0550)     Initial Impression / Assessment and Plan / ED Course  I have reviewed the triage  vital signs and the nursing notes.  Pertinent labs & imaging results that were available during my care of the patient were reviewed by me and considered in my medical decision making (see chart for details).  Clinical Course as of Jan 03 716  [facility] Jan 03, 2019  1740 He reports no improvement after Dilaudid.  He asked for multiple additional doses of Dilaudid and ketamine.   [HM]    Clinical Course User Index [HM] Tobe Kervin, Dahlia Client, [place]       Patient presents with acute exacerbation of chronic abdominal pain.  Record thoroughly reviewed.  It appears that he is consistently asking for narcotics for this including with his outpatient GI.  He has had significant work-ups at [facility] [place] and [place].  He has been seen here multiple times with similar symptoms.  He does have some mild guarding on exam but no rebound.  No signs of peritonitis.  His vital signs are within normal limits.  Patient without tachycardia, hypoxia, hypotension or tachypnea.  No Covid-like symptoms.  No evidence of sepsis.  Patient initially given fluids, Zofran and Dilaudid.  He reports this has not helped at all and  continues to ask for multiple doses of Dilaudid and ketamine.  Patient additionally given ketorolac.  Outpatient GI is recommending Levsin for his chronic pain.  It appears that they have discussed with him that he is not to have narcotics for this however he continues to request them.  Levsin given here in the emergency department.  Low suspicion for appendicitis, diverticulitis, urinary tract infection, pyelonephritis, colitis or other acute abdominal pathology.  He has no purpura or petechiae on exam.  The patient was discussed with Dr. Nicanor Alcon who agrees with the treatment plan.  7:16 AM At shift change care was transferred to [facility], PA-C.  Patient is pending urinalysis.  If patient does not have gross hematuria he may be discharged home with outpatient follow-up for GI.  Would not recommend discharge home with narcotics.     Final Clinical Impressions(s) / ED Diagnoses   Final diagnoses:  Abdominal pain, chronic, left lower quadrant    ED Discharge Orders    None       Mardene Sayer Boyd Kerbs 01/03/19 0719    Palumbo, April, MD 01/03/19 0725

## 2022-07-24 DIAGNOSIS — M459 Ankylosing spondylitis of unspecified sites in spine: Secondary | ICD-10-CM | POA: Diagnosis present

## 2023-11-30 ENCOUNTER — Encounter (HOSPITAL_COMMUNITY): Payer: Self-pay

## 2023-11-30 ENCOUNTER — Observation Stay (HOSPITAL_COMMUNITY)
Admission: EM | Admit: 2023-11-30 | Discharge: 2023-12-02 | DRG: 813 | Disposition: A | Payer: Self-pay | Attending: Internal Medicine | Admitting: Internal Medicine

## 2023-11-30 ENCOUNTER — Other Ambulatory Visit: Payer: Self-pay

## 2023-11-30 ENCOUNTER — Emergency Department (HOSPITAL_COMMUNITY): Payer: Self-pay

## 2023-11-30 DIAGNOSIS — Z880 Allergy status to penicillin: Secondary | ICD-10-CM

## 2023-11-30 DIAGNOSIS — R112 Nausea with vomiting, unspecified: Secondary | ICD-10-CM | POA: Diagnosis present

## 2023-11-30 DIAGNOSIS — R109 Unspecified abdominal pain: Secondary | ICD-10-CM | POA: Diagnosis present

## 2023-11-30 DIAGNOSIS — E039 Hypothyroidism, unspecified: Secondary | ICD-10-CM | POA: Diagnosis present

## 2023-11-30 DIAGNOSIS — E669 Obesity, unspecified: Secondary | ICD-10-CM | POA: Diagnosis present

## 2023-11-30 DIAGNOSIS — Z6831 Body mass index (BMI) 31.0-31.9, adult: Secondary | ICD-10-CM

## 2023-11-30 DIAGNOSIS — Z885 Allergy status to narcotic agent status: Secondary | ICD-10-CM

## 2023-11-30 DIAGNOSIS — Z79899 Other long term (current) drug therapy: Secondary | ICD-10-CM

## 2023-11-30 DIAGNOSIS — Z8249 Family history of ischemic heart disease and other diseases of the circulatory system: Secondary | ICD-10-CM

## 2023-11-30 DIAGNOSIS — J189 Pneumonia, unspecified organism: Secondary | ICD-10-CM | POA: Diagnosis present

## 2023-11-30 DIAGNOSIS — F988 Other specified behavioral and emotional disorders with onset usually occurring in childhood and adolescence: Secondary | ICD-10-CM | POA: Diagnosis present

## 2023-11-30 DIAGNOSIS — M459 Ankylosing spondylitis of unspecified sites in spine: Secondary | ICD-10-CM | POA: Diagnosis present

## 2023-11-30 DIAGNOSIS — D690 Allergic purpura: Principal | ICD-10-CM | POA: Diagnosis present

## 2023-11-30 DIAGNOSIS — Z862 Personal history of diseases of the blood and blood-forming organs and certain disorders involving the immune mechanism: Secondary | ICD-10-CM

## 2023-11-30 DIAGNOSIS — R1032 Left lower quadrant pain: Principal | ICD-10-CM

## 2023-11-30 LAB — COMPREHENSIVE METABOLIC PANEL WITH GFR
ALT: 11 U/L (ref 0–44)
AST: 18 U/L (ref 15–41)
Albumin: 4.1 g/dL (ref 3.5–5.0)
Alkaline Phosphatase: 121 U/L (ref 38–126)
Anion gap: 9 (ref 5–15)
BUN: 14 mg/dL (ref 6–20)
CO2: 25 mmol/L (ref 22–32)
Calcium: 8.8 mg/dL — ABNORMAL LOW (ref 8.9–10.3)
Chloride: 103 mmol/L (ref 98–111)
Creatinine, Ser: 1.12 mg/dL (ref 0.61–1.24)
GFR, Estimated: >60 mL/min (ref >60)
Glucose, Bld: 118 mg/dL — ABNORMAL HIGH (ref 70–99)
Potassium: 4.8 mmol/L (ref 3.5–5.1)
Sodium: 137 mmol/L (ref 135–145)
Total Bilirubin: 0.5 mg/dL (ref 0.0–1.2)
Total Protein: 6.8 g/dL (ref 6.5–8.1)

## 2023-11-30 LAB — CBC
HCT: 45.5 % (ref 39.0–52.0)
Hemoglobin: 16.6 g/dL (ref 13.0–17.0)
MCH: 31.9 pg (ref 26.0–34.0)
MCHC: 36.5 g/dL — ABNORMAL HIGH (ref 30.0–36.0)
MCV: 87.3 fL (ref 80.0–100.0)
Platelets: 194 K/uL (ref 150–400)
RBC: 5.21 MIL/uL (ref 4.22–5.81)
RDW: 12.4 % (ref 11.5–15.5)
WBC: 19.2 K/uL — ABNORMAL HIGH (ref 4.0–10.5)
nRBC: 0 % (ref 0.0–0.2)

## 2023-11-30 LAB — I-STAT CHEM 8, ED
BUN: 13 mg/dL (ref 6–20)
Calcium, Ion: 1.15 mmol/L (ref 1.15–1.40)
Chloride: 104 mmol/L (ref 98–111)
Creatinine, Ser: 1.1 mg/dL (ref 0.61–1.24)
Glucose, Bld: 114 mg/dL — ABNORMAL HIGH (ref 70–99)
HCT: 43 % (ref 39.0–52.0)
Hemoglobin: 14.6 g/dL (ref 13.0–17.0)
Potassium: 4.5 mmol/L (ref 3.5–5.1)
Sodium: 140 mmol/L (ref 135–145)
TCO2: 23 mmol/L (ref 22–32)

## 2023-11-30 LAB — URINALYSIS, ROUTINE W REFLEX MICROSCOPIC
Bilirubin Urine: NEGATIVE
Glucose, UA: NEGATIVE mg/dL
Hgb urine dipstick: NEGATIVE
Ketones, ur: NEGATIVE mg/dL
Leukocytes,Ua: NEGATIVE
Nitrite: NEGATIVE
Protein, ur: NEGATIVE mg/dL
Specific Gravity, Urine: >1.046 — ABNORMAL HIGH (ref 1.005–1.030)
pH: 6 (ref 5.0–8.0)

## 2023-11-30 LAB — URINE DRUG SCREEN
Amphetamines: NEGATIVE
Barbiturates: NEGATIVE
Benzodiazepines: NEGATIVE
Cocaine: NEGATIVE
Fentanyl: POSITIVE — AB
Methadone Scn, Ur: NEGATIVE
Opiates: NEGATIVE
Tetrahydrocannabinol: NEGATIVE

## 2023-11-30 LAB — LIPASE, BLOOD: Lipase: 32 U/L (ref 11–51)

## 2023-11-30 MED ORDER — ACETAMINOPHEN 325 MG PO TABS
650.0000 mg | ORAL_TABLET | Freq: Four times a day (QID) | ORAL | Status: DC | PRN
  Administered 2023-12-01: 650 mg via ORAL
  Filled 2023-11-30: qty 2

## 2023-11-30 MED ORDER — PANTOPRAZOLE SODIUM 40 MG IV SOLR
40.0000 mg | Freq: Once | INTRAVENOUS | Status: AC
Start: 2023-11-30 — End: 2023-11-30
  Administered 2023-11-30: 40 mg via INTRAVENOUS
  Filled 2023-11-30: qty 10

## 2023-11-30 MED ORDER — ONDANSETRON HCL 4 MG/2ML IJ SOLN
4.0000 mg | Freq: Once | INTRAMUSCULAR | Status: AC
  Administered 2023-11-30: 4 mg via INTRAVENOUS
  Filled 2023-11-30: qty 2

## 2023-11-30 MED ORDER — SODIUM CHLORIDE 0.9 % IV BOLUS
1000.0000 mL | Freq: Once | INTRAVENOUS | Status: AC
  Administered 2023-11-30: 1000 mL via INTRAVENOUS

## 2023-11-30 MED ORDER — HYDROMORPHONE HCL 1 MG/ML IJ SOLN
1.0000 mg | Freq: Once | INTRAMUSCULAR | Status: AC
  Administered 2023-11-30: 1 mg via INTRAVENOUS
  Filled 2023-11-30: qty 1

## 2023-11-30 MED ORDER — SODIUM CHLORIDE (PF) 0.9 % IJ SOLN
INTRAMUSCULAR | Status: AC
  Filled 2023-11-30: qty 50

## 2023-11-30 MED ORDER — HYDROMORPHONE HCL 1 MG/ML IJ SOLN: 1.0000 mg | INTRAMUSCULAR | Status: DC | PRN

## 2023-11-30 MED ORDER — ACETAMINOPHEN 650 MG RE SUPP: 650.0000 mg | Freq: Four times a day (QID) | RECTAL | Status: DC | PRN

## 2023-11-30 MED ORDER — FENTANYL CITRATE PF 50 MCG/ML IJ SOSY
100.0000 ug | PREFILLED_SYRINGE | Freq: Once | INTRAMUSCULAR | Status: AC
  Administered 2023-11-30: 100 ug via INTRAVENOUS
  Filled 2023-11-30: qty 2

## 2023-11-30 MED ORDER — IOHEXOL 300 MG/ML  SOLN
100.0000 mL | Freq: Once | INTRAMUSCULAR | Status: AC | PRN
  Administered 2023-11-30: 100 mL via INTRAVENOUS

## 2023-11-30 MED ORDER — SODIUM CHLORIDE 0.45 % IV SOLN
INTRAVENOUS | Status: DC
Start: 2023-11-30 — End: 2023-12-01

## 2023-11-30 MED ORDER — ONDANSETRON HCL 4 MG PO TABS: 4.0000 mg | ORAL_TABLET | Freq: Four times a day (QID) | ORAL | Status: DC | PRN

## 2023-11-30 MED ORDER — OXYCODONE HCL 5 MG PO TABS
5.0000 mg | ORAL_TABLET | ORAL | Status: DC | PRN
  Administered 2023-12-01: 5 mg via ORAL
  Filled 2023-11-30: qty 1

## 2023-11-30 MED ORDER — LEVOTHYROXINE SODIUM 50 MCG PO TABS
50.0000 ug | ORAL_TABLET | Freq: Every day | ORAL | Status: DC
Start: 2023-12-01 — End: 2023-12-02
  Administered 2023-12-01 – 2023-12-02 (×2): 50 ug via ORAL
  Filled 2023-11-30 (×2): qty 1

## 2023-11-30 MED ORDER — HYDROMORPHONE HCL 1 MG/ML IJ SOLN
1.0000 mg | INTRAMUSCULAR | Status: DC | PRN
  Administered 2023-11-30 – 2023-12-01 (×7): 1 mg via INTRAVENOUS
  Filled 2023-11-30 (×7): qty 1

## 2023-11-30 MED ORDER — ONDANSETRON HCL 4 MG/2ML IJ SOLN
4.0000 mg | Freq: Four times a day (QID) | INTRAMUSCULAR | Status: DC | PRN
  Administered 2023-12-01 (×2): 4 mg via INTRAVENOUS
  Filled 2023-11-30 (×2): qty 2

## 2023-11-30 MED ORDER — FENTANYL CITRATE PF 50 MCG/ML IJ SOSY
50.0000 ug | PREFILLED_SYRINGE | Freq: Once | INTRAMUSCULAR | Status: AC
  Administered 2023-11-30: 50 ug via INTRAVENOUS
  Filled 2023-11-30: qty 1

## 2023-11-30 MED ORDER — METHYLPREDNISOLONE SODIUM SUCC 125 MG IJ SOLR
125.0000 mg | Freq: Once | INTRAMUSCULAR | Status: AC
  Administered 2023-11-30: 125 mg via INTRAVENOUS
  Filled 2023-11-30: qty 2

## 2023-11-30 NOTE — H&P (Signed)
 History and Physical    Patient: Gregory Conway DOB: Feb 14, 1996 DOA: 11/30/2023 DOS: the patient was seen and examined on 11/30/2023 PCP: Sophronia Ozell BROCKS, MD  Patient coming from: Home  Chief Complaint:  Chief Complaint  Patient presents with   Abdominal Pain   HPI: Gregory Conway is a 28 y.o. male with medical history significant of ADHD, ankylosing spondylitis, GERD, Henoch-Schnlein purpura, chronic left lower quadrant abdominal pain who presented with exacerbation of LLQ abdominal pain for the past 3 days associated with numerous episodes of emesis and diarrhea.  Symptoms are similar to when he had an exacerbation of his Henoch-Schnlein purpura. No constipation, melena or hematochezia.  No flank pain, dysuria, frequency or hematuria.He denied fever, chills, rhinorrhea, sore throat, wheezing or hemoptysis.  No chest pain, palpitations, diaphoresis, PND, orthopnea or pitting edema of the lower extremities.    No polyuria, polydipsia, polyphagia or blurred vision.   Lab work: CBC showed a white count of 19.2, hemoglobin 16.6 g/dL platelets are 805.  Lipase is normal.  CMP showed a glucose of 119 and calcium of 8.8 mg/dL, the rest of the CMP measurements were normal.  Imaging: CT abdomen/pelvis with contrast with no acute findings displaying abdominal pain.  There is Perry bronchovascular ground glass in the right middle lobe, infectious/inflammatory etiology.  Leaver appears mildly asteatotic.  Tiny left renal stone.   ED course: Initial vital signs temperature 98 F, pulse, respiration 19, BP 153/94 mmHg and O2 sat 99% on room air.  Patient received fentanyl  100 mcg IVP, fentanyl  50 mcg IVP, hydromorphone  1 mg IVP x 4, ondansetron  4 mg IVP x 3, normal saline 1000 mL liter bolus and methylprednisolone  125 mg IVP.  Review of Systems: As mentioned in the history of present illness. All other systems reviewed and are negative. Past Medical History:  Diagnosis Date   Acne     ADHD    GERD (gastroesophageal reflux disease)    HSP (Henoch Schonlein purpura)    Past Surgical History:  Procedure Laterality Date   KNEE SURGERY     Social History:  reports that he has never smoked. He has never used smokeless tobacco. He reports current alcohol use. He reports that he does not use drugs.  Allergies  Allergen Reactions   Haloperidol  Other (See Comments)    Pt reports psychosis with past administration   Morphine  And Codeine Nausea And Vomiting    Worsens pain.    Penicillins Other (See Comments)    Childhood allergy.  Has patient had a PCN reaction causing immediate rash, facial/tongue/throat swelling, SOB or lightheadedness with hypotension: Unknown Has patient had a PCN reaction causing severe rash involving mucus membranes or skin necrosis: unknown Has patient had a PCN reaction that required hospitalization unknown Has patient had a PCN reaction occurring within the last 10 years: No If all of the above answers are NO, then may proceed with Cephalosporin use.   Tramadol  Other (See Comments)    Headaches    Family History  Problem Relation Age of Onset   Lupus Mother    Hypertension Father     Prior to Admission medications   Medication Sig Start Date End Date Taking? Authorizing Provider  HUMIRA PEN 40 MG/0.4ML PNKT Inject 40 mg into the skin every Sunday.  12/05/16   [provider]  Multiple Vitamin (MULTIVITAMIN WITH MINERALS) TABS tablet Take 1 tablet by mouth daily.    [provider]    Physical Exam: Vitals:  11/30/23 0831 11/30/23 0832 11/30/23 1036  BP: (!) 153/94  110/81  Pulse: 100  89  Resp: 19  18  Temp: 98 F (36.7 C)  97.7 F (36.5 C)  TempSrc: Oral  Oral  SpO2: 99%  97%  Weight:  104.3 kg   Height:  6' (1.829 m)    Physical Exam Vitals and nursing note reviewed.  Constitutional:      Appearance: He is ill-appearing.  HENT:     Head: Normocephalic.     Nose: No rhinorrhea.     Mouth/Throat:      Mouth: Mucous membranes are dry.  Eyes:     General: No scleral icterus.    Pupils: Pupils are equal, round, and reactive to light.  Cardiovascular:     Rate and Rhythm: Regular rhythm. Tachycardia present.  Pulmonary:     Effort: Pulmonary effort is normal.     Breath sounds: Normal breath sounds. No wheezing, rhonchi or rales.  Abdominal:     General: Bowel sounds are normal. There is no distension.     Palpations: Abdomen is soft.     Tenderness: There is abdominal tenderness in the left lower quadrant. There is no right CVA tenderness, left CVA tenderness, guarding or rebound.  Musculoskeletal:     Cervical back: Neck supple.     Right lower leg: No edema.     Left lower leg: No edema.  Skin:    General: Skin is warm and dry.  Neurological:     General: No focal deficit present.     Mental Status: He is alert and oriented to person, place, and time.  Psychiatric:        Mood and Affect: Mood normal. Mood is not anxious or depressed.        Behavior: Behavior normal.     Data Reviewed:  Results are pending, will review when available.  Assessment and Plan: Principal Problem:   Intractable abdominal pain Associated with:   Intractable nausea and vomiting  Observation/MedSurg. Continue IV fluids. Full liquid diet. Advance as tolerated. Analgesics as needed. Antiemetics as needed. Pantoprazole  40 mg IVP x 1. Follow CBC, CMP in AM.  Active Problems:   Ankylosing spondylitis (HCC) On Humira. Follow-up with rheumatology.    History of Henoch-Schonlein purpura Continue current treatment as above. Received methylprednisolone  in the emergency department.    Hypothyroidism  Continue levothyroxine 50 mcg p.o. daily.    ADD (attention deficit disorder) Will hold dexmethylphenidate for now.    Advance Care Planning:   Code Status: Full Code   Consults:   Family Communication:   Severity of Illness: The appropriate patient status for this patient is  OBSERVATION. Observation status is judged to be reasonable and necessary in order to provide the required intensity of service to ensure the patient's safety. The patient's presenting symptoms, physical exam findings, and initial radiographic and laboratory data in the context of their medical condition is felt to place them at decreased risk for further clinical deterioration. Furthermore, it is anticipated that the patient will be medically stable for discharge from the hospital within 2 midnights of admission.   Author: Alm Dorn Castor, MD 11/30/2023 2:10 PM  For on call review [email or web address].   This document was prepared using Dragon voice recognition software and may contain some unintended transcription errors.

## 2023-11-30 NOTE — ED Notes (Signed)
 Pt is currently vomiting; RN at bedside.

## 2023-11-30 NOTE — ED Triage Notes (Signed)
 Patient has had lower left quadrant abdominal pain for 3 days. Stated he has had vomiting and diarrhea.

## 2023-11-30 NOTE — ED Provider Notes (Signed)
 [facility]ENC[facility] DEPARTMENT AT [facility] Provider Note   CSN: [id number] Arrival date & time: 11/30/23  9171     Patient presents with: Abdominal Pain   Gregory Conway is a 28 y.o. male.   28 year old male presents today for concern of severe left lower quadrant abdominal pain.  He states this started 3 days ago.  He had associated nausea and vomiting since that time as well.  He states he has history of Henoch-Schnlein purpura and had similar episode a few years ago.  Denies history of abdominal surgeries.  Denies being recently sick.  The history is provided by the patient. No language interpreter was used.       Prior to Admission medications   Medication Sig Start Date End Date Taking? Authorizing Provider  HUMIRA PEN 40 MG/0.4ML PNKT Inject 40 mg into the skin every Sunday.  12/05/16   [provider]  Multiple Vitamin (MULTIVITAMIN WITH MINERALS) TABS tablet Take 1 tablet by mouth daily.    [provider]    Allergies: Haloperidol , Morphine  and codeine, Penicillins, and Tramadol     Review of Systems  Constitutional:  Negative for chills and fever.  Respiratory:  Negative for shortness of breath.   Gastrointestinal:  Positive for abdominal pain, diarrhea, nausea and vomiting.  Neurological:  Negative for light-headedness.  All other systems reviewed and are negative.   Updated Vital Signs BP (!) 153/94   Pulse 100   Temp 98 F (36.7 C) (Oral)   Resp 19   Ht 6' (1.829 m)   Wt 104.3 kg   SpO2 99%   BMI 31.19 kg/m   Physical Exam Vitals and nursing note reviewed.  Constitutional:      General: He is not in acute distress.    Appearance: Normal appearance. He is not ill-appearing.  HENT:     Head: Normocephalic and atraumatic.     Nose: Nose normal.  Eyes:     Conjunctiva/sclera: Conjunctivae normal.  Pulmonary:     Effort: Pulmonary effort is normal. No respiratory distress.  Abdominal:     General: There is no  distension.     Tenderness: There is abdominal tenderness. There is guarding.  Musculoskeletal:        General: No deformity. Normal range of motion.     Cervical back: Normal range of motion.  Skin:    Findings: No rash.  Neurological:     Mental Status: He is alert.     (all labs ordered are listed, but only abnormal results are displayed) Labs Reviewed  CBC - Abnormal; Notable for the following components:      Result Value   WBC 19.2 (*)    MCHC 36.5 (*)    All other components within normal limits  URINALYSIS, ROUTINE W REFLEX MICROSCOPIC  COMPREHENSIVE METABOLIC PANEL WITH GFR  LIPASE, BLOOD  I-STAT CHEM 8, ED    EKG: None  Radiology: No results found.   Procedures   Medications Ordered in the ED  fentaNYL  (SUBLIMAZE ) injection 50 mcg (has no administration in time range)  sodium chloride  0.9 % bolus 1,000 mL (1,000 mLs Intravenous New Bag/Given 11/30/23 0933)  ondansetron  (ZOFRAN ) injection 4 mg (4 mg Intravenous Given 11/30/23 0932)  HYDROmorphone  (DILAUDID ) injection 1 mg (1 mg Intravenous Given 11/30/23 0932)    Clinical Course as of 11/30/23 1452  Mon Nov 30, 2023  1312 Feeling improved but states pain is coming back.  Would like 1 additional dose of pain medicine.  Also stating that typically steroids help with his condition when it flares up. Bladder scan was 317.  He does not feel the urge to go but states he took a muscle relaxer last night.  He will work on trying to void.   CT without acute intra-abdominal process.  Shows potential inflammatory/infectious process in the middle lobe.  He does not have any pneumonia symptoms.  Will treat and have him follow-up with his PCP. [AA]  1400 Patient not feeling any better.  Report significant pain and nausea.  Discussion had regarding admission and he would like to be admitted because he states he cannot go home with this amount of pain. [AA]    Clinical Course User Index [AA] Hildegard Loge, PA-C                                  Medical Decision Making Amount and/or Complexity of Data Reviewed Labs: ordered. Radiology: ordered.  Risk Prescription drug management. Decision regarding hospitalization.   Medical Decision Making / ED Course   This patient presents to the ED for concern of abdominal pain, this involves an extensive number of treatment options, and is a complaint that carries with it a high risk of complications and morbidity.  The differential diagnosis includes HSP flare, gastroenteritis, colitis, diverticulitis, UTI, nephrolithiasis, pyelonephritis  MDM: 28 year old male presents today for concern of severe left lower quadrant abdominal pain which he states is consistent with his previous HSP flareups.  Leukocytosis of 19,000 on CBC.  CMP without acute concern.  CT abdomen pelvis shows no acute intra-abdominal findings. Does show potential inflammation or infection in his mid lung.  He does not have any infectious symptoms.  Will not treat at this time.  Uncontrolled symptoms.  Patient feels comfortable with admission.  Discussed with hospitalist will evaluate patient for admission.   Additional history obtained: -Additional history obtained from chart review -External records from outside source obtained and reviewed including: Chart review including previous notes, labs, imaging, consultation notes   Lab Tests: -I ordered, reviewed, and interpreted labs.   The pertinent results include:   Labs Reviewed  CBC - Abnormal; Notable for the following components:      Result Value   WBC 19.2 (*)    MCHC 36.5 (*)    All other components within normal limits  COMPREHENSIVE METABOLIC PANEL WITH GFR - Abnormal; Notable for the following components:   Glucose, Bld 118 (*)    Calcium 8.8 (*)    All other components within normal limits  I-STAT CHEM 8, ED - Abnormal; Notable for the following components:   Glucose, Bld 114 (*)    All other components within normal limits   LIPASE, BLOOD  URINALYSIS, ROUTINE W REFLEX MICROSCOPIC  DIFFERENTIAL      EKG  EKG Interpretation Date/Time:    Ventricular Rate:    PR Interval:    QRS Duration:    QT Interval:    QTC Calculation:   R Axis:      Text Interpretation:           Imaging Studies ordered: I ordered imaging studies including CT abdomen pelvis with contrast I independently visualized and interpreted imaging. I agree with the radiologist interpretation   Medicines ordered and prescription drug management: Meds ordered this encounter  Medications   sodium chloride  0.9 % bolus 1,000 mL   ondansetron  (ZOFRAN ) injection 4 mg   HYDROmorphone  (DILAUDID ) injection  1 mg   fentaNYL  (SUBLIMAZE ) injection 50 mcg   HYDROmorphone  (DILAUDID ) injection 1 mg   ondansetron  (ZOFRAN ) injection 4 mg   iohexol  (OMNIPAQUE ) 300 MG/ML solution 100 mL   HYDROmorphone  (DILAUDID ) injection 1 mg   methylPREDNISolone  sodium succinate (SOLU-MEDROL ) 125 mg/2 mL injection 125 mg    IV methylprednisolone  will be converted to either a q12h or q24h frequency with the same total daily dose (TDD).  Ordered Dose: 1 to 125 mg TDD; convert to: TDD q24h.  Ordered Dose: 126 to 250 mg TDD; convert to: TDD div q12h.  Ordered Dose: >250 mg TDD; DAW.   fentaNYL  (SUBLIMAZE ) injection 100 mcg   ondansetron  (ZOFRAN ) injection 4 mg    -I have reviewed the patients home medicines and have made adjustments as needed  Reevaluation: After the interventions noted above, I reevaluated the patient and found that they have :stayed the same  Co morbidities that complicate the patient evaluation  Past Medical History:  Diagnosis Date   Acne    ADHD    GERD (gastroesophageal reflux disease)    HSP (Henoch Schonlein purpura)       Dispostion: Discussed with hospitalist will evaluate patient for admission    Final diagnoses:  Left lower quadrant abdominal pain    ED Discharge Orders     None          Hildegard Loge,  PA-C 11/30/23 1510    Suzette Pac, MD 11/30/23 1548

## 2023-11-30 NOTE — ED Notes (Signed)
Patient reports still unable to urinate.

## 2023-11-30 NOTE — ED Notes (Signed)
Pt is still unable to provide a urine sample.  

## 2023-11-30 NOTE — ED Notes (Signed)
 Patient given apple juice and reminded that we need a urine sample.  Urinal is at bedside.

## 2023-11-30 NOTE — ED Notes (Signed)
 Patient states he has been having difficulty coping with pain over the last few days and is depressed about it.  He has a vascular specialist and has been seen for a study at [facility] for his condition.  He has a two year old and 2 month at home and seems to be under a lot of stress as well.

## 2023-11-30 NOTE — ED Notes (Signed)
 Patient sleeping post med administration.  No sign of distress noted.

## 2023-12-01 ENCOUNTER — Encounter (HOSPITAL_COMMUNITY): Payer: Self-pay | Admitting: Internal Medicine

## 2023-12-01 LAB — COMPREHENSIVE METABOLIC PANEL WITH GFR
ALT: 10 U/L (ref 0–44)
AST: 17 U/L (ref 15–41)
Albumin: 4 g/dL (ref 3.5–5.0)
Alkaline Phosphatase: 114 U/L (ref 38–126)
Anion gap: 11 (ref 5–15)
BUN: 11 mg/dL (ref 6–20)
CO2: 22 mmol/L (ref 22–32)
Calcium: 8.7 mg/dL — ABNORMAL LOW (ref 8.9–10.3)
Chloride: 103 mmol/L (ref 98–111)
Creatinine, Ser: 1.03 mg/dL (ref 0.61–1.24)
GFR, Estimated: >60 mL/min (ref >60)
Glucose, Bld: 133 mg/dL — ABNORMAL HIGH (ref 70–99)
Potassium: 4.4 mmol/L (ref 3.5–5.1)
Sodium: 136 mmol/L (ref 135–145)
Total Bilirubin: 0.5 mg/dL (ref 0.0–1.2)
Total Protein: 6.4 g/dL — ABNORMAL LOW (ref 6.5–8.1)

## 2023-12-01 LAB — CBC
HCT: 42.1 % (ref 39.0–52.0)
Hemoglobin: 14.7 g/dL (ref 13.0–17.0)
MCH: 31 pg (ref 26.0–34.0)
MCHC: 34.9 g/dL (ref 30.0–36.0)
MCV: 88.8 fL (ref 80.0–100.0)
Platelets: 267 K/uL (ref 150–400)
RBC: 4.74 MIL/uL (ref 4.22–5.81)
RDW: 12.6 % (ref 11.5–15.5)
WBC: 18.5 K/uL — ABNORMAL HIGH (ref 4.0–10.5)
nRBC: 0 % (ref 0.0–0.2)

## 2023-12-01 LAB — HIV ANTIBODY (ROUTINE TESTING W REFLEX): HIV Screen 4th Generation wRfx: NONREACTIVE

## 2023-12-01 MED ORDER — ALUM & MAG HYDROXIDE-SIMETH 200-200-20 MG/5ML PO SUSP
30.0000 mL | ORAL | Status: DC | PRN
  Administered 2023-12-01: 30 mL via ORAL
  Filled 2023-12-01: qty 30

## 2023-12-01 MED ORDER — HYDROMORPHONE HCL 1 MG/ML IJ SOLN: 1.5000 mg | INTRAMUSCULAR | Status: DC | PRN

## 2023-12-01 MED ORDER — HYDROMORPHONE HCL 1 MG/ML IJ SOLN
1.5000 mg | INTRAMUSCULAR | Status: DC | PRN
  Administered 2023-12-01 – 2023-12-02 (×8): 1.5 mg via INTRAVENOUS
  Filled 2023-12-01 (×5): qty 1.5
  Filled 2023-12-01: qty 2
  Filled 2023-12-01 (×3): qty 1.5

## 2023-12-01 MED ORDER — SODIUM CHLORIDE 0.9 % IV SOLN
500.0000 mg | INTRAVENOUS | Status: DC
  Administered 2023-12-01 – 2023-12-02 (×2): 500 mg via INTRAVENOUS
  Filled 2023-12-01 (×2): qty 5

## 2023-12-01 MED ORDER — LORAZEPAM 2 MG/ML IJ SOLN
0.5000 mg | Freq: Four times a day (QID) | INTRAMUSCULAR | Status: DC | PRN
  Administered 2023-12-01: 0.5 mg via INTRAVENOUS
  Filled 2023-12-01: qty 1

## 2023-12-01 MED ORDER — METHYLPREDNISOLONE SODIUM SUCC 40 MG IJ SOLR
40.0000 mg | Freq: Two times a day (BID) | INTRAMUSCULAR | Status: DC
  Administered 2023-12-01 – 2023-12-02 (×3): 40 mg via INTRAVENOUS
  Filled 2023-12-01 (×3): qty 1

## 2023-12-01 MED ORDER — SODIUM CHLORIDE 0.9 % IV SOLN
2.0000 g | INTRAVENOUS | Status: DC
  Administered 2023-12-01 – 2023-12-02 (×2): 2 g via INTRAVENOUS
  Filled 2023-12-01 (×2): qty 20

## 2023-12-01 NOTE — Progress Notes (Signed)
 PROGRESS NOTE    Gregory Conway  FMW:[id number] DOB: 10-Apr-1995 DOA: 11/30/2023 PCP: Sophronia Ozell BROCKS, MD    Brief Narrative:  Gregory Conway is a 28 y.o. male with medical history significant of ADHD, ankylosing spondylitis, GERD, Henoch-Schnlein purpura, chronic left lower quadrant abdominal pain who presented with exacerbation of LLQ abdominal pain for the past 3 days associated with numerous episodes of emesis and diarrhea.  Symptoms are similar to when he had an exacerbation of his Henoch-Schnlein purpura.   Assessment and Plan:  Intractable abdominal pain   Intractable nausea and vomiting  Full liquid diet. Advance as tolerated. Analgesics as needed. Antiemetics as needed.    ? PNA -check procalcitonin -+ cough, elevated WBC--reports fever and chills at home -add abx    Ankylosing spondylitis (HCC) On Humira. Follow-up with rheumatology.     History of Henoch-Schonlein purpura Continue current treatment as above. Received methylprednisolone  in the emergency department-- schedule dose -last hospitalized in 2023: Intractable Abdominal Pain-likely secondary to HSP Flare. Treated with high dose ibuprofen  and IV solumedrol. He has high tolerance for pain meds and typically gets norco from PCP. Diet was advanced and pt tolerated it well. He was transitioned to PO oxycodone . I offered him another day in the hospital and slow tapering of IV solumedrol but he stated that he was feeling better and could taper at home. Will discharge with prednisone  taper and 3 days of oxycodone . He can f/u with PCP in 1 week and with rheumatology + GI at [facility] in 1-2 weeks.      Hypothyroidism  -levothyroxine 50 mcg p.o. daily.     ADD (attention deficit disorder) -hold dexmethylphenidate for now.   DVT prophylaxis: SCDs Start: 11/30/23 1455    Code Status: Full Code Family Communication:   Disposition Plan:  Level of care: Telemetry Status is: Observation     Consultants:   none   Subjective: Says he is still not able to tolerate anything by mouth-- throws it all back up  Objective: Vitals:   12/01/23 0446 12/01/23 0555 12/01/23 0748 12/01/23 0753  BP: (!) 146/92  (!) 142/85 (!) 142/85  Pulse: 98  93 87  Resp: 18   17  Temp:  97.9 F (36.6 C) 98.4 F (36.9 C) 98.5 F (36.9 C)  TempSrc:  Oral Oral Oral  SpO2: 96%   98%  Weight:      Height:        Intake/Output Summary (Last 24 hours) at 12/01/2023 0827 Last data filed at 11/30/2023 1021 Gross per 24 hour  Intake 1000 ml  Output --  Net 1000 ml   Filed Weights   11/30/23 0832  Weight: 104.3 kg    Examination:   General: Appearance:    Obese male in no acute distress     Lungs:     respirations unlabored  Heart:    Normal heart rate. Normal rhythm. No murmurs, rubs, or gallops.    MS:   All extremities are intact.    Neurologic:   Awake, alert, oriented x 3. No apparent focal neurological           defect.        Data Reviewed: I have personally reviewed following labs and imaging studies  CBC: Recent Labs  Lab 11/30/23 0833 11/30/23 1115 12/01/23 0220  WBC 19.2*  --  18.5*  HGB 16.6 14.6 14.7  HCT 45.5 43.0 42.1  MCV 87.3  --  88.8  PLT 194  --  267   Basic Metabolic Panel: Recent Labs  Lab 11/30/23 0950 11/30/23 1115 12/01/23 0220  NA 137 140 136  K 4.8 4.5 4.4  CL 103 104 103  CO2 25  --  22  GLUCOSE 118* 114* 133*  BUN 14 13 11   CREATININE 1.12 1.10 1.03  CALCIUM 8.8*  --  8.7*   GFR: Estimated Creatinine Clearance: 132.2 mL/min (by C-G formula based on SCr of 1.03 mg/dL). Liver Function Tests: Recent Labs  Lab 11/30/23 0950 12/01/23 0220  AST 18 17  ALT 11 10  ALKPHOS 121 114  BILITOT 0.5 0.5  PROT 6.8 6.4*  ALBUMIN 4.1 4.0   Recent Labs  Lab 11/30/23 0950  LIPASE 32   No results for input(s): AMMONIA in the last 168 hours. Coagulation Profile: No results for input(s): INR, PROTIME in the last 168 hours. Cardiac Enzymes: No  results for input(s): CKTOTAL, CKMB, CKMBINDEX, TROPONINI in the last 168 hours. BNP (last 3 results) No results for input(s): PROBNP in the last 8760 hours. HbA1C: No results for input(s): HGBA1C in the last 72 hours. CBG: No results for input(s): GLUCAP in the last 168 hours. Lipid Profile: No results for input(s): CHOL, HDL, LDLCALC, TRIG, CHOLHDL, LDLDIRECT in the last 72 hours. Thyroid Function Tests: No results for input(s): TSH, T4TOTAL, FREET4, T3FREE, THYROIDAB in the last 72 hours. Anemia Panel: No results for input(s): VITAMINB12, FOLATE, FERRITIN, TIBC, IRON, RETICCTPCT in the last 72 hours. Sepsis Labs: No results for input(s): PROCALCITON, LATICACIDVEN in the last 168 hours.  No results found for this or any previous visit (from the past 240 hours).       Radiology Studies: CT ABDOMEN PELVIS W CONTRAST Result Date: 11/30/2023 CLINICAL DATA:  Acute abdominal pain. EXAM: CT ABDOMEN AND PELVIS WITH CONTRAST TECHNIQUE: Multidetector CT imaging of the abdomen and pelvis was performed using the standard protocol following bolus administration of intravenous contrast. RADIATION DOSE REDUCTION: This exam was performed according to the departmental dose-optimization program which includes automated exposure control, adjustment of the mA and/or kV according to patient size and/or use of iterative reconstruction technique. CONTRAST:  OMNIPAQUE  IOHEXOL  300 MG/ML  SOLN COMPARISON:  01/22/2018. FINDINGS: Lower chest: Mild dependent atelectasis bilaterally. Peribronchovascular ground-glass in right middle lobe. Heart is at the upper limits of normal in size. No pericardial or pleural effusion. Distal esophagus is grossly unremarkable. Hepatobiliary: Liver may be slightly decreased in attenuation diffusely. Liver and gallbladder are otherwise unremarkable. No biliary ductal dilatation. Pancreas: Negative. Spleen: Negative.  Adrenals/Urinary Tract: Adrenal glands and right kidney are unremarkable. Tiny left renal stone. Kidneys are otherwise unremarkable. Ureters are decompressed. Bladder is grossly unremarkable. Stomach/Bowel: Stomach, small bowel, appendix and colon are unremarkable. Vascular/Lymphatic: Vascular structures are unremarkable. No pathologically enlarged lymph nodes. Reproductive: Prostate is normal in size. Other: No free fluid.  Mesenteries and peritoneum are unremarkable. Musculoskeletal: None. IMPRESSION: 1. No acute findings to explain the patient's abdominal pain. 2. Peribronchovascular ground-glass in the right middle lobe, infectious/inflammatory in etiology. 3. Liver appears mildly steatotic. 4. Tiny left renal stone. Electronically Signed   By: Newell Eke M.D.   On: 11/30/2023 13:07        Scheduled Meds:  levothyroxine  50 mcg Oral QAC breakfast   Continuous Infusions:  azithromycin     cefTRIAXone (ROCEPHIN)  IV 2 g (12/01/23 0813)     LOS: 0 days    Time spent: 45 minutes spent on chart review, discussion with nursing staff, consultants, updating family and  interview/physical exam; more than 50% of that time was spent in counseling and/or coordination of care.    Harlene RAYMOND Bowl, DO [facility] Available via Epic secure chat 7am-7pm After these hours, please refer to coverage provider listed on [email or web address] 12/01/2023, 8:27 AM

## 2023-12-01 NOTE — Plan of Care (Signed)

## 2023-12-01 NOTE — ED Notes (Signed)
 Pt repeatedly removing BP cuff and pulse oximeter.  Pt states I can't keep my arm straight, can we just turn the fluids off?  RN paused IV pump.

## 2023-12-01 NOTE — ED Notes (Signed)
 Floor coverage notified of pt request for A1C blood test.  Floor coverage also notified that patient repeatedly reports high pain scores even after IV and PO pain medication.

## 2023-12-01 NOTE — Plan of Care (Signed)
  Problem: Clinical Measurements: Goal: Diagnostic test results will improve Outcome: Progressing   Problem: Activity: Goal: Risk for activity intolerance will decrease Outcome: Completed/Met   Problem: Nutrition: Goal: Adequate nutrition will be maintained Outcome: Completed/Met

## 2023-12-02 DIAGNOSIS — M459 Ankylosing spondylitis of unspecified sites in spine: Secondary | ICD-10-CM

## 2023-12-02 DIAGNOSIS — E039 Hypothyroidism, unspecified: Secondary | ICD-10-CM

## 2023-12-02 DIAGNOSIS — F909 Attention-deficit hyperactivity disorder, unspecified type: Secondary | ICD-10-CM

## 2023-12-02 DIAGNOSIS — Z862 Personal history of diseases of the blood and blood-forming organs and certain disorders involving the immune mechanism: Secondary | ICD-10-CM

## 2023-12-02 DIAGNOSIS — R112 Nausea with vomiting, unspecified: Secondary | ICD-10-CM

## 2023-12-02 LAB — CBC
HCT: 42.1 % (ref 39.0–52.0)
Hemoglobin: 14.1 g/dL (ref 13.0–17.0)
MCH: 30.6 pg (ref 26.0–34.0)
MCHC: 33.5 g/dL (ref 30.0–36.0)
MCV: 91.3 fL (ref 80.0–100.0)
Platelets: 156 K/uL (ref 150–400)
RBC: 4.61 MIL/uL (ref 4.22–5.81)
RDW: 12.7 % (ref 11.5–15.5)
WBC: 11.6 K/uL — ABNORMAL HIGH (ref 4.0–10.5)
nRBC: 0 % (ref 0.0–0.2)

## 2023-12-02 LAB — BASIC METABOLIC PANEL WITH GFR
Anion gap: 12 (ref 5–15)
BUN: 11 mg/dL (ref 6–20)
CO2: 24 mmol/L (ref 22–32)
Calcium: 9.1 mg/dL (ref 8.9–10.3)
Chloride: 101 mmol/L (ref 98–111)
Creatinine, Ser: 1.09 mg/dL (ref 0.61–1.24)
GFR, Estimated: >60 mL/min (ref >60)
Glucose, Bld: 131 mg/dL — ABNORMAL HIGH (ref 70–99)
Potassium: 4.2 mmol/L (ref 3.5–5.1)
Sodium: 137 mmol/L (ref 135–145)

## 2023-12-02 LAB — PROCALCITONIN: Procalcitonin: <0.1 ng/mL

## 2023-12-02 MED ORDER — OXYCODONE HCL 5 MG PO TABS: 5.0000 mg | ORAL_TABLET | ORAL | Status: DC | PRN

## 2023-12-02 MED ORDER — SENNOSIDES-DOCUSATE SODIUM 8.6-50 MG PO TABS: 1.0000 | ORAL_TABLET | Freq: Two times a day (BID) | ORAL | 0 refills | Status: AC

## 2023-12-02 MED ORDER — PREDNISONE 20 MG PO TABS
ORAL_TABLET | ORAL | 0 refills | Status: AC
Start: 2023-12-03 — End: 2023-12-11

## 2023-12-02 MED ORDER — CEFPODOXIME PROXETIL 200 MG PO TABS: 200.0000 mg | ORAL_TABLET | Freq: Two times a day (BID) | ORAL | 0 refills | Status: AC

## 2023-12-02 MED ORDER — CYCLOBENZAPRINE HCL 5 MG PO TABS
5.0000 mg | ORAL_TABLET | Freq: Three times a day (TID) | ORAL | Status: DC | PRN
Start: 2023-12-02 — End: 2023-12-02

## 2023-12-02 MED ORDER — POLYETHYLENE GLYCOL 3350 17 G PO PACK
17.0000 g | PACK | Freq: Two times a day (BID) | ORAL | Status: DC
  Filled 2023-12-02: qty 1

## 2023-12-02 MED ORDER — SENNOSIDES-DOCUSATE SODIUM 8.6-50 MG PO TABS
1.0000 | ORAL_TABLET | Freq: Two times a day (BID) | ORAL | Status: DC
  Filled 2023-12-02: qty 1

## 2023-12-02 MED ORDER — POLYETHYLENE GLYCOL 3350 17 G PO PACK: 17.0000 g | PACK | Freq: Two times a day (BID) | ORAL | 0 refills | Status: AC

## 2023-12-02 MED ORDER — OXYCODONE-ACETAMINOPHEN 5-325 MG PO TABS
1.0000 | ORAL_TABLET | Freq: Four times a day (QID) | ORAL | Status: DC | PRN
Start: 2023-12-02 — End: 2023-12-02
  Administered 2023-12-02: 1 via ORAL
  Filled 2023-12-02 (×2): qty 1

## 2023-12-02 MED ORDER — ONDANSETRON 4 MG PO TBDP: 4.0000 mg | ORAL_TABLET | Freq: Three times a day (TID) | ORAL | 0 refills | Status: AC | PRN

## 2023-12-02 NOTE — Plan of Care (Signed)

## 2023-12-02 NOTE — Progress Notes (Signed)
 PROGRESS NOTE    Gregory Conway  FMW:[id number] DOB: [birth year] DOA: 11/30/2023 PCP: Sophronia Ozell BROCKS, MD    Chief Complaint  Patient presents with   Abdominal Pain    Brief Narrative:  Gregory Conway is a 28 y.o. male with medical history significant of ADHD, ankylosing spondylitis, GERD, Henoch-Schnlein purpura, chronic left lower quadrant abdominal pain who presented with exacerbation of LLQ abdominal pain for the past 3 days associated with numerous episodes of emesis and diarrhea.  Symptoms are similar to when he had an exacerbation of his Henoch-Schnlein purpura.    Assessment & Plan:   Principal Problem:   Intractable abdominal pain Active Problems:   ADD (attention deficit disorder)   Ankylosing spondylitis (HCC)   History of Henoch-Schonlein purpura   Intractable nausea and vomiting   Hypothyroidism  #1 intractable abdominal pain/intractable nausea and vomiting -Concern for possible exacerbation of patient's Henoch-Schnlein purpura. - CT abdomen and pelvis with no acute abdominal findings noted. - Patient tolerating clear liquid/full liquid diet and asking for diet to be advanced. - Advance to soft diet. - Continue current analgesics as needed. - IV antiemetics as needed.  2.??  Pneumonia -CT abdomen and pelvis done with peribronchovascular ground glass in the right middle lobe, infectious/inflammatory in etiology - Patient noted to have presented with numerous episodes of nausea and emesis.  Patient with lower abdominal pain. - Leukocytosis trended down. - Continue empiric IV antibiotics and once tolerating oral intake consistently will transition to Augmentin to complete a course of antibiotic treatment.  3.  Ankylosing spondylitis -Noted to be on Humira - Outpatient follow-up with primary rheumatologist.  4.  History of Henoch-Schnlein purpura -Continue current treatment as noted above. - Patient noted to have received methylprednisolone  in the ED  and currently on scheduled IV Solu-Medrol . - It is noted per Dr. Juvenal, that during last hospitalization in 2023 with intractable abdominal pain felt likely secondary to HSP flare patient was treated with high-dose ibuprofen  and IV Solu-Medrol . - Patient had a high tolerance for pain meds and typically gets Norco from his PCP. - Tolerating clear liquid/full liquid diet and diet being advanced to a soft diet. - Continue IV Solu-Medrol  while in house and discharged on a prednisone  taper and oral pain medications with close outpatient follow-up with PCP in 1 week and rheumatology and GI at [facility] in 1 to 2 weeks.  5.  Hypothyroidism -Continue levothyroxine.  6.  ADD -Continue to hold dexmethylphenidate for now.   DVT prophylaxis: SCDs Code Status: Full Family Communication: Updated patient.  No family at bedside. Disposition: Likely home when tolerating oral intake, continued clinical improvement hopefully in the next 24 hours.  Status is: Inpatient Remains inpatient appropriate because: Severity of illness.   Consultants:  None  Procedures:  CT abdomen and pelvis 11/30/2023   Antimicrobials:  Anti-infectives (From admission, onward)    Start     Dose/Rate Route Frequency Ordered Stop   12/01/23 0745  azithromycin (ZITHROMAX) 500 mg in sodium chloride  0.9 % 250 mL IVPB        500 mg 250 mL/hr over 60 Minutes Intravenous Every 24 hours 12/01/23 0735     12/01/23 0745  cefTRIAXone (ROCEPHIN) 2 g in sodium chloride  0.9 % 100 mL IVPB        2 g 200 mL/hr over 30 Minutes Intravenous Every 24 hours 12/01/23 0735           Subjective: Patient sitting up in bed.  States some improvement with  abdominal pain however still with diffuse abdominal pain.  Tolerating clear liquids/full liquids.  Denies any nausea or vomiting today.  Objective: Vitals:   12/01/23 2054 12/02/23 0457 12/02/23 0931 12/02/23 1436  BP: 139/84 122/69 114/66 118/71  Pulse: 89 93 95 (!) 103  Resp: 18 18 18 18    Temp: 97.9 F (36.6 C) 98.6 F (37 C)  97.9 F (36.6 C)  TempSrc: Oral Oral  Oral  SpO2: 97% 95% 96% 95%  Weight:      Height:        Intake/Output Summary (Last 24 hours) at 12/02/2023 1600 Last data filed at 12/02/2023 1500 Gross per 24 hour  Intake 2604.86 ml  Output --  Net 2604.86 ml   Filed Weights   11/30/23 0832  Weight: 104.3 kg    Examination:  General exam: Appears calm and comfortable  Respiratory system: Clear to auscultation. Respiratory effort normal. Cardiovascular system: S1 & S2 heard, RRR. No JVD, murmurs, rubs, gallops or clicks. No pedal edema. Gastrointestinal system: Abdomen is nondistended, soft and diffuse tenderness to palpation.  No rebound.  No guarding.  Central nervous system: Alert and oriented. No focal neurological deficits. Extremities: Symmetric 5 x 5 power. Skin: No rashes, lesions or ulcers Psychiatry: Judgement and insight appear normal. Mood & affect appropriate.     Data Reviewed: I have personally reviewed following labs and imaging studies  CBC: Recent Labs  Lab 11/30/23 0833 11/30/23 1115 12/01/23 0220 12/02/23 0511  WBC 19.2*  --  18.5* 11.6*  HGB 16.6 14.6 14.7 14.1  HCT 45.5 43.0 42.1 42.1  MCV 87.3  --  88.8 91.3  PLT 194  --  267 156    Basic Metabolic Panel: Recent Labs  Lab 11/30/23 0950 11/30/23 1115 12/01/23 0220 12/02/23 0511  NA 137 140 136 137  K 4.8 4.5 4.4 4.2  CL 103 104 103 101  CO2 25  --  22 24  GLUCOSE 118* 114* 133* 131*  BUN 14 13 11 11   CREATININE 1.12 1.10 1.03 1.09  CALCIUM 8.8*  --  8.7* 9.1    GFR: Estimated Creatinine Clearance: 124.9 mL/min (by C-G formula based on SCr of 1.09 mg/dL).  Liver Function Tests: Recent Labs  Lab 11/30/23 0950 12/01/23 0220  AST 18 17  ALT 11 10  ALKPHOS 121 114  BILITOT 0.5 0.5  PROT 6.8 6.4*  ALBUMIN 4.1 4.0    CBG: No results for input(s): GLUCAP in the last 168 hours.   No results found for this or any previous visit (from the  past 240 hours).       Radiology Studies: No results found.       Scheduled Meds:  levothyroxine  50 mcg Oral QAC breakfast   methylPREDNISolone  (SOLU-MEDROL ) injection  40 mg Intravenous Q12H   polyethylene glycol  17 g Oral BID   senna-docusate  1 tablet Oral BID   Continuous Infusions:  azithromycin 500 mg (12/02/23 0620)   cefTRIAXone (ROCEPHIN)  IV 2 g (12/02/23 0809)     LOS: 1 day    Time spent: 40 minutes    Toribio Hummer, MD [facility]   To contact the attending provider between 7A-7P or the covering provider during after hours 7P-7A, please log into the web site [email or web address] and access using universal [place] password for that web site. If you do not have the password, please call the hospital operator.  12/02/2023, 4:00 PM

## 2023-12-02 NOTE — Progress Notes (Signed)
 Pt was discharged home today. Instructions were reviewed with patient, and questions were answered and work note given to patient.  Pt was taken to main entrance via wheelchair by NT.

## 2023-12-02 NOTE — Discharge Summary (Signed)
 Physician Discharge Summary  Gregory Conway FMW:[id number] DOB: 11/14/1995 DOA: 11/30/2023  PCP: Gregory Ozell BROCKS, MD  Admit date: 11/30/2023 Discharge date: 12/02/2023  Time spent: 60 minutes  Recommendations for Outpatient Follow-up:  Follow-up with Dr. Maree, rheumatology in 1 week. Follow-up with Gregory Ozell BROCKS, MD in 1 to 2 weeks.   Discharge Diagnoses:  Principal Problem:   Intractable abdominal pain Active Problems:   ADD (attention deficit disorder)   Ankylosing spondylitis (HCC)   History of Henoch-Schonlein purpura   Intractable nausea and vomiting   Hypothyroidism   Discharge Condition: Stable and improved.  Diet recommendation: Soft diet  Filed Weights   11/30/23 0832  Weight: 104.3 kg    History of present illness:  HPI per Dr. Celinda Elsie [name] is a 28 y.o. male with medical history significant of ADHD, ankylosing spondylitis, GERD, Henoch-Schnlein purpura, chronic left lower quadrant abdominal pain who presented with exacerbation of LLQ abdominal pain for the past 3 days associated with numerous episodes of emesis and diarrhea.  Symptoms are similar to when he had an exacerbation of his Henoch-Schnlein purpura. No constipation, melena or hematochezia.  No flank pain, dysuria, frequency or hematuria.He denied fever, chills, rhinorrhea, sore throat, wheezing or hemoptysis.  No chest pain, palpitations, diaphoresis, PND, orthopnea or pitting edema of the lower extremities.    No polyuria, polydipsia, polyphagia or blurred vision.    Lab work: CBC showed a white count of 19.2, hemoglobin 16.6 g/dL platelets are 805.  Lipase is normal.  CMP showed a glucose of 119 and calcium of 8.8 mg/dL, the rest of the CMP measurements were normal.   Imaging: CT abdomen/pelvis with contrast with no acute findings displaying abdominal pain.  There is Perry bronchovascular ground glass in the right middle lobe, infectious/inflammatory etiology.  Leaver appears mildly  asteatotic.  Tiny left renal stone.   ED course: Initial vital signs temperature 98 F, pulse, respiration 19, BP 153/94 mmHg and O2 sat 99% on room air.  Patient received fentanyl  100 mcg IVP, fentanyl  50 mcg IVP, hydromorphone  1 mg IVP x 4, ondansetron  4 mg IVP x 3, normal saline 1000 mL liter bolus and methylprednisolone  125 mg IVP.  Hospital Course:  #1 intractable abdominal pain/intractable nausea and vomiting -Concern for possible exacerbation of patient's Henoch-Schnlein purpura. - CT abdomen and pelvis with no acute abdominal findings noted. - Patient initially placed on a clear liquid diet diet advanced to full liquid and subsequently a soft diet which he tolerated  - Patient's pain managed during the hospitalization as well as nausea and emesis with IV antiemetics. - Patient improved clinically, had no further nausea or vomiting by day of discharge and be discharged home in stable and improved condition.   - Outpatient follow-up with PCP.     2.??  Pneumonia -CT abdomen and pelvis done with peribronchovascular ground glass in the right middle lobe, infectious/inflammatory in etiology - Patient noted to have presented with numerous episodes of nausea and emesis.  Patient with lower abdominal pain. - Leukocytosis trended down. - Patient maintained on IV antibiotics and will be discharged home on Vantin for 5 more days to complete an empiric 7-day course of antibiotic treatment.   - Outpatient follow-up with PCP.   3.  Ankylosing spondylitis -Noted to be on Humira - Outpatient follow-up with primary rheumatologist.   4.  History of Henoch-Schnlein purpura -Continue current treatment as noted above. - Patient noted to have received methylprednisolone  in the ED and currently on scheduled IV  Solu-Medrol . - It is noted per Dr. Juvenal, that during last hospitalization in 2023 with intractable abdominal pain felt likely secondary to HSP flare patient was treated with high-dose ibuprofen   and IV Solu-Medrol . - Patient had a high tolerance for pain meds and typically gets Norco from his PCP. - Patient placed on a clear liquid diet which he tolerated, diet advanced to full liquid diet which he tolerated and subsequently a soft diet.   - Patient maintained on IV Solu-Medrol  during the hospitalization improved clinically and be discharged on a prednisone  taper with close outpatient follow-up with PCP in 1 week and rheumatology at [facility] in 1 week.     5.  Hypothyroidism - Patient maintained on home regimen levothyroxine.   6.  ADD - Patient dexmethylphenidate was held during the hospitalization will be resumed on discharge.     Procedures: CT abdomen and pelvis 11/30/2023   Consultations: None  Discharge Exam: Vitals:   12/02/23 0931 12/02/23 1436  BP: 114/66 118/71  Pulse: 95 (!) 103  Resp: 18 18  Temp:  97.9 F (36.6 C)  SpO2: 96% 95%    General: NAD Cardiovascular: RRR no murmurs rubs or gallops.  No JVD.  No lower extremity edema Respiratory: Clear to auscultation bilaterally.  No wheezes, no crackles, no rhonchi.  Fair air movement.  Speaking in full sentences.  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 Start taking on: December 03, 2023   cyclobenzaprine 5 MG tablet Commonly known as: FLEXERIL Take  5-10 mg by mouth 3 (three) times daily as needed for muscle spasms.   dexmethylphenidate 10 MG tablet Commonly known as: FOCALIN Take 10 mg by mouth 2 (two) times daily.   Ibuprofen  200 MG Caps Take 800 mg by mouth every 6 (six) hours as needed (for pain or headaches).   levothyroxine 50 MCG tablet Commonly known as: SYNTHROID Take 50 mcg by mouth daily before breakfast.   ondansetron  4 MG disintegrating tablet Commonly known as: ZOFRAN -ODT Take 1 tablet (4 mg total) by mouth every 8 (eight) hours as needed for nausea (dissolve orally).   oxyCODONE -acetaminophen  10-325 MG tablet Commonly known as: PERCOCET Take 1 tablet by mouth every 6 (six) hours as needed for pain.   polyethylene glycol 17 g packet Commonly known as: MIRALAX / GLYCOLAX Take 17 g by mouth 2 (two) times daily.   predniSONE  20 MG tablet Commonly known as: DELTASONE  Take 2 tablets (40 mg total) by mouth daily with breakfast for 4 days, THEN 1 tablet (20 mg total) daily with breakfast for 4 days. Start taking on: December 03, 2023   senna-docusate 8.6-50 MG tablet Commonly known as: Senokot-S Take 1 tablet by mouth 2 (two) times  daily.       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 Schedule an appointment as soon as possible for a visit in 2 week(s).   Specialty: Family Medicine Why: Follow-up in 1 to 2 weeks Contact information: [address] [place] [address] [phone]         Gregory Conway Korene PEDLAR, MD Follow up in 1 week(s).   Specialty: Rheumatology Contact information: [address] [place] [address] [phone]                   The results of significant diagnostics from this hospitalization (including imaging, microbiology, ancillary and laboratory) are listed below for reference.    Significant Diagnostic Studies: CT ABDOMEN PELVIS W CONTRAST Result Date: 11/30/2023 CLINICAL DATA:  Acute abdominal pain. EXAM: CT ABDOMEN AND PELVIS WITH CONTRAST TECHNIQUE: Multidetector CT imaging of the abdomen and pelvis was performed using the standard protocol following bolus administration of intravenous contrast. RADIATION DOSE REDUCTION: This exam was performed according to the departmental dose-optimization program which includes automated exposure control, adjustment of the mA and/or kV according to patient size and/or use of iterative reconstruction technique. CONTRAST:  OMNIPAQUE  IOHEXOL  300 MG/ML  SOLN COMPARISON:  01/22/2018. FINDINGS: Lower chest: Mild dependent atelectasis bilaterally. Peribronchovascular ground-glass in right middle lobe. Heart is at the upper limits of normal in size. No pericardial or pleural effusion. Distal esophagus is grossly unremarkable. Hepatobiliary: Liver may be slightly decreased in attenuation diffusely. Liver and gallbladder are otherwise unremarkable. No biliary ductal dilatation. Pancreas: Negative. Spleen: Negative. Adrenals/Urinary Tract: Adrenal glands and right kidney are unremarkable. Tiny left renal stone. Kidneys are otherwise unremarkable. Ureters are decompressed. Bladder is grossly unremarkable. Stomach/Bowel: Stomach, small bowel, appendix and colon are unremarkable. Vascular/Lymphatic: Vascular structures are unremarkable. No pathologically enlarged lymph nodes. Reproductive: Prostate is normal in size. Other: No free fluid.  Mesenteries and peritoneum are unremarkable. Musculoskeletal: None. IMPRESSION: 1. No acute findings to explain the patient's abdominal pain. 2. Peribronchovascular ground-glass in the right middle lobe, infectious/inflammatory in etiology. 3.  Liver appears mildly steatotic. 4. Tiny left renal stone. Electronically Signed   By: Newell Eke M.D.   On: 11/30/2023 13:07    Microbiology: No results found for this or any previous visit (from the past 240 hours).   Labs: Basic Metabolic Panel: Recent Labs  Lab 11/30/23 0950 11/30/23 1115 12/01/23 0220 12/02/23 0511  NA 137 140 136 137  K 4.8 4.5 4.4 4.2  CL 103 104 103 101  CO2 25  --  22 24  GLUCOSE 118* 114* 133* 131*  BUN 14 13 11 11   CREATININE 1.12 1.10 1.03 1.09  CALCIUM 8.8*  --  8.7* 9.1   Liver Function Tests: Recent Labs  Lab 11/30/23 0950 12/01/23 0220  AST 18 17  ALT 11 10  ALKPHOS 121 114  BILITOT 0.5 0.5  PROT 6.8 6.4*  ALBUMIN 4.1 4.0   Recent Labs  Lab 11/30/23 0950  LIPASE 32   No results for input(s): AMMONIA in the last 168 hours. CBC: Recent Labs  Lab 11/30/23 [phone] 11/30/23 1115  12/01/23 0220 12/02/23 0511  WBC 19.2*  --  18.5* 11.6*  HGB 16.6 14.6 14.7 14.1  HCT 45.5 43.0 42.1 42.1  MCV 87.3  --  88.8 91.3  PLT 194  --  267 156   Cardiac Enzymes: No results for input(s): CKTOTAL, CKMB, CKMBINDEX, TROPONINI in the last 168 hours. BNP: BNP (last 3 results) No results for input(s): BNP in the last 8760 hours.  ProBNP (last 3 results) No results for input(s): PROBNP in the last 8760 hours.  CBG: No results for input(s): GLUCAP in the last 168 hours.     Signed:  Toribio Hummer MD.  [facility] 12/02/2023, 5:14 PM
